# Patient Record
Sex: Female | Born: 1940 | Race: White | Hispanic: No | Smoking: Never smoker
Health system: Southern US, Community
[De-identification: ages and names within clinical notes are randomized; demographics above are authoritative.]

## PROBLEM LIST (undated history)

## (undated) DIAGNOSIS — I10 Essential (primary) hypertension: Secondary | ICD-10-CM

## (undated) DIAGNOSIS — M7061 Trochanteric bursitis, right hip: Secondary | ICD-10-CM

## (undated) DIAGNOSIS — Z9289 Personal history of other medical treatment: Secondary | ICD-10-CM

## (undated) DIAGNOSIS — M1990 Unspecified osteoarthritis, unspecified site: Secondary | ICD-10-CM

## (undated) DIAGNOSIS — E785 Hyperlipidemia, unspecified: Secondary | ICD-10-CM

## (undated) DIAGNOSIS — M5416 Radiculopathy, lumbar region: Secondary | ICD-10-CM

## (undated) DIAGNOSIS — G479 Sleep disorder, unspecified: Secondary | ICD-10-CM

## (undated) DIAGNOSIS — K279 Peptic ulcer, site unspecified, unspecified as acute or chronic, without hemorrhage or perforation: Secondary | ICD-10-CM

## (undated) HISTORY — DX: Hyperlipidemia, unspecified: E78.5

## (undated) HISTORY — DX: Peptic ulcer, site unspecified, unspecified as acute or chronic, without hemorrhage or perforation: K27.9

## (undated) HISTORY — PX: TOTAL KNEE ARTHROPLASTY: SHX125

## (undated) HISTORY — DX: Unspecified osteoarthritis, unspecified site: M19.90

## (undated) HISTORY — DX: Essential (primary) hypertension: I10

## (undated) HISTORY — DX: Personal history of other medical treatment: Z92.89

## (undated) HISTORY — PX: NASAL TURBINATE REDUCTION: SHX2072

## (undated) HISTORY — DX: Trochanteric bursitis, right hip: M70.61

## (undated) HISTORY — DX: Sleep disorder, unspecified: G47.9

## (undated) HISTORY — DX: Radiculopathy, lumbar region: M54.16

---
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
## 1963-07-04 HISTORY — PX: BREAST BIOPSY: SHX20

## 1968-07-03 HISTORY — PX: WISDOM TOOTH EXTRACTION: SHX21

## 1973-07-03 HISTORY — PX: TUBAL LIGATION: SHX77

## 1983-07-04 HISTORY — PX: ANKLE SURGERY: SHX546

## 1985-07-03 HISTORY — PX: ABDOMINAL HYSTERECTOMY: SHX81

## 1985-07-03 HISTORY — PX: TOTAL ABDOMINAL HYSTERECTOMY: SHX209

## 1988-07-03 HISTORY — PX: BREAST REDUCTION SURGERY: SHX8

## 1998-02-01 ENCOUNTER — Other Ambulatory Visit: Admission: RE | Admit: 1998-02-01 | Discharge: 1998-02-01 | Payer: Self-pay | Admitting: Obstetrics and Gynecology

## 1998-04-21 ENCOUNTER — Ambulatory Visit (HOSPITAL_COMMUNITY): Admission: RE | Admit: 1998-04-21 | Discharge: 1998-04-21 | Payer: Self-pay | Admitting: Obstetrics and Gynecology

## 1999-02-09 ENCOUNTER — Other Ambulatory Visit: Admission: RE | Admit: 1999-02-09 | Discharge: 1999-02-09 | Payer: Self-pay | Admitting: Obstetrics and Gynecology

## 1999-04-25 ENCOUNTER — Ambulatory Visit (HOSPITAL_COMMUNITY): Admission: RE | Admit: 1999-04-25 | Discharge: 1999-04-25 | Payer: Self-pay | Admitting: Obstetrics and Gynecology

## 1999-04-25 ENCOUNTER — Encounter: Payer: Self-pay | Admitting: Obstetrics and Gynecology

## 2000-02-15 ENCOUNTER — Other Ambulatory Visit: Admission: RE | Admit: 2000-02-15 | Discharge: 2000-02-15 | Payer: Self-pay | Admitting: Obstetrics and Gynecology

## 2000-04-26 ENCOUNTER — Encounter: Payer: Self-pay | Admitting: Obstetrics and Gynecology

## 2000-04-26 ENCOUNTER — Ambulatory Visit (HOSPITAL_COMMUNITY): Admission: RE | Admit: 2000-04-26 | Discharge: 2000-04-26 | Payer: Self-pay | Admitting: Obstetrics and Gynecology

## 2001-02-22 ENCOUNTER — Other Ambulatory Visit: Admission: RE | Admit: 2001-02-22 | Discharge: 2001-02-22 | Payer: Self-pay | Admitting: Obstetrics and Gynecology

## 2001-12-31 ENCOUNTER — Encounter: Admission: RE | Admit: 2001-12-31 | Discharge: 2001-12-31 | Payer: Self-pay | Admitting: Otolaryngology

## 2001-12-31 ENCOUNTER — Encounter: Payer: Self-pay | Admitting: Otolaryngology

## 2002-07-03 HISTORY — PX: CHOLECYSTECTOMY: SHX55

## 2002-07-03 HISTORY — PX: CATARACT EXTRACTION W/ INTRAOCULAR LENS  IMPLANT, BILATERAL: SHX1307

## 2002-08-04 ENCOUNTER — Encounter (HOSPITAL_BASED_OUTPATIENT_CLINIC_OR_DEPARTMENT_OTHER): Payer: Self-pay | Admitting: General Surgery

## 2002-08-05 ENCOUNTER — Encounter (INDEPENDENT_AMBULATORY_CARE_PROVIDER_SITE_OTHER): Payer: Self-pay | Admitting: *Deleted

## 2002-08-05 ENCOUNTER — Ambulatory Visit (HOSPITAL_COMMUNITY): Admission: RE | Admit: 2002-08-05 | Discharge: 2002-08-06 | Payer: Self-pay | Admitting: General Surgery

## 2002-08-05 ENCOUNTER — Encounter (HOSPITAL_BASED_OUTPATIENT_CLINIC_OR_DEPARTMENT_OTHER): Payer: Self-pay | Admitting: General Surgery

## 2002-09-29 ENCOUNTER — Other Ambulatory Visit: Admission: RE | Admit: 2002-09-29 | Discharge: 2002-09-29 | Payer: Self-pay | Admitting: Gynecology

## 2002-10-02 ENCOUNTER — Ambulatory Visit (HOSPITAL_COMMUNITY): Admission: RE | Admit: 2002-10-02 | Discharge: 2002-10-02 | Payer: Self-pay | Admitting: Gynecology

## 2002-10-02 ENCOUNTER — Encounter: Payer: Self-pay | Admitting: Gynecology

## 2003-03-03 ENCOUNTER — Encounter: Admission: RE | Admit: 2003-03-03 | Discharge: 2003-03-26 | Payer: Self-pay | Admitting: Orthopedic Surgery

## 2003-11-17 ENCOUNTER — Other Ambulatory Visit: Admission: RE | Admit: 2003-11-17 | Discharge: 2003-11-17 | Payer: Self-pay | Admitting: Gynecology

## 2003-12-04 ENCOUNTER — Ambulatory Visit (HOSPITAL_COMMUNITY): Admission: RE | Admit: 2003-12-04 | Discharge: 2003-12-04 | Payer: Self-pay | Admitting: Gynecology

## 2004-03-10 ENCOUNTER — Encounter: Admission: RE | Admit: 2004-03-10 | Discharge: 2004-03-10 | Payer: Self-pay | Admitting: Family Medicine

## 2004-11-22 ENCOUNTER — Other Ambulatory Visit: Admission: RE | Admit: 2004-11-22 | Discharge: 2004-11-22 | Payer: Self-pay | Admitting: Gynecology

## 2005-04-03 ENCOUNTER — Ambulatory Visit (HOSPITAL_COMMUNITY): Admission: RE | Admit: 2005-04-03 | Discharge: 2005-04-03 | Payer: Self-pay | Admitting: Gynecology

## 2005-06-14 ENCOUNTER — Ambulatory Visit (HOSPITAL_COMMUNITY): Admission: RE | Admit: 2005-06-14 | Discharge: 2005-06-14 | Payer: Self-pay | Admitting: Family Medicine

## 2005-07-14 HISTORY — PX: COLONOSCOPY: SHX174

## 2005-07-24 ENCOUNTER — Inpatient Hospital Stay (HOSPITAL_COMMUNITY): Admission: EM | Admit: 2005-07-24 | Discharge: 2005-07-26 | Payer: Self-pay | Admitting: Emergency Medicine

## 2005-09-04 ENCOUNTER — Observation Stay (HOSPITAL_COMMUNITY): Admission: EM | Admit: 2005-09-04 | Discharge: 2005-09-05 | Payer: Self-pay | Admitting: Emergency Medicine

## 2005-11-23 ENCOUNTER — Other Ambulatory Visit: Admission: RE | Admit: 2005-11-23 | Discharge: 2005-11-23 | Payer: Self-pay | Admitting: Gynecology

## 2006-04-26 ENCOUNTER — Ambulatory Visit (HOSPITAL_COMMUNITY): Admission: RE | Admit: 2006-04-26 | Discharge: 2006-04-26 | Payer: Self-pay | Admitting: Gynecology

## 2007-05-29 ENCOUNTER — Ambulatory Visit (HOSPITAL_COMMUNITY): Admission: RE | Admit: 2007-05-29 | Discharge: 2007-05-29 | Payer: Self-pay | Admitting: Gynecology

## 2008-04-13 ENCOUNTER — Encounter: Admission: RE | Admit: 2008-04-13 | Discharge: 2008-04-13 | Payer: Self-pay | Admitting: Neurology

## 2008-05-26 ENCOUNTER — Ambulatory Visit (HOSPITAL_COMMUNITY): Admission: RE | Admit: 2008-05-26 | Discharge: 2008-05-26 | Payer: Self-pay | Admitting: Gynecology

## 2010-07-23 ENCOUNTER — Encounter: Payer: Self-pay | Admitting: Gynecology

## 2010-09-24 IMAGING — CT CT HEAD WO/W CM
1 of 2 series · 13 of 30 positions shown, 17 images · non-contrast
Comparison: none

[Series 32: 3d filtered head w/o · axial · non-contrast · 0.43mm/px · z∈[-12,+123]mm · 13 of 32 slices shown, 17 images]
[im 3/32  brain]
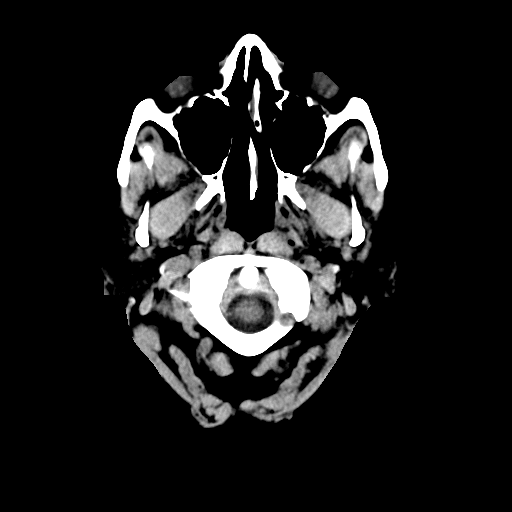
[im 3/32  bone]
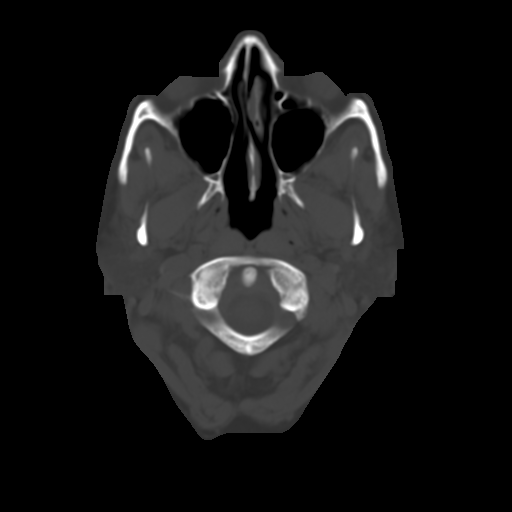
[im 5/32  brain]
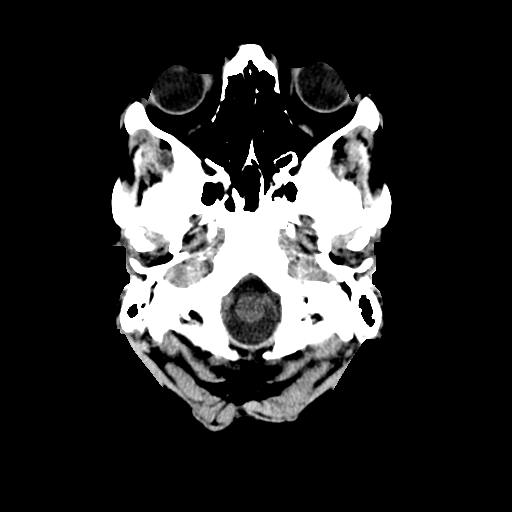
[im 7/32  brain]
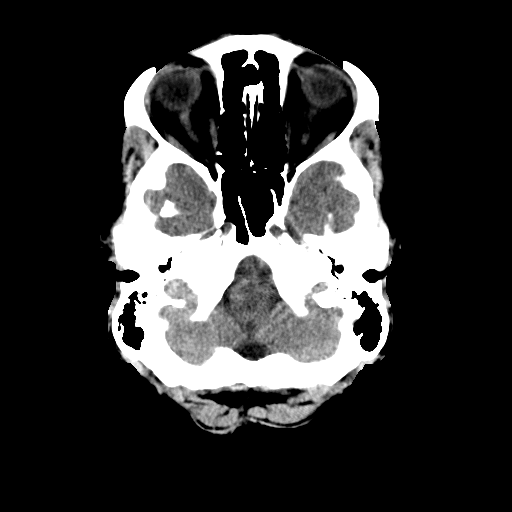
[im 9/32  brain]
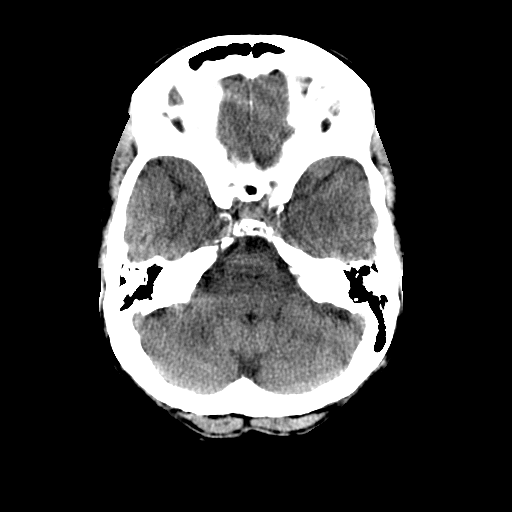
[im 12/32  brain]
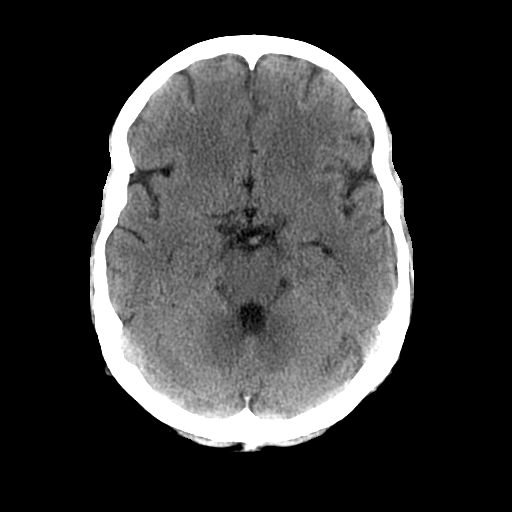
[im 12/32  bone]
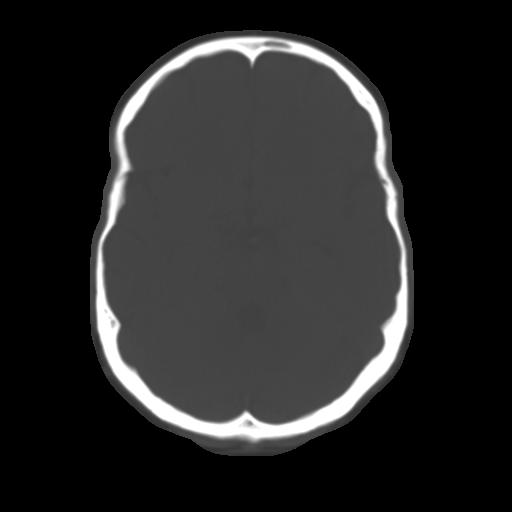
[im 14/32  brain]
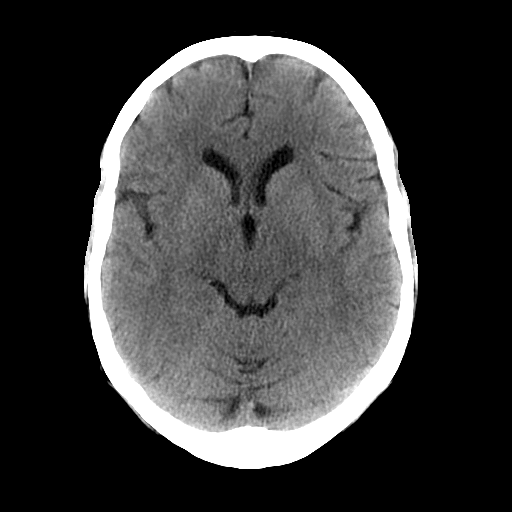
[im 16/32  brain]
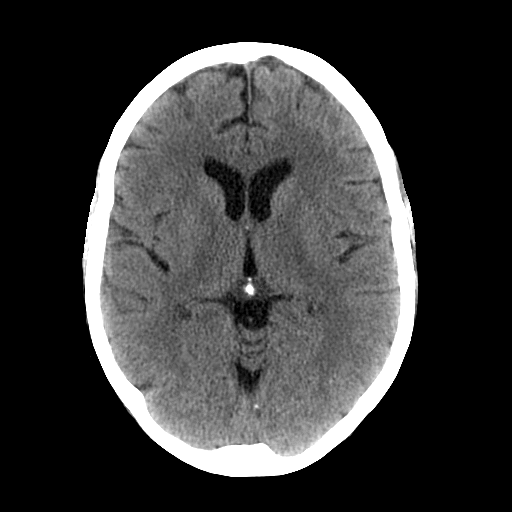
[im 18/32  brain]
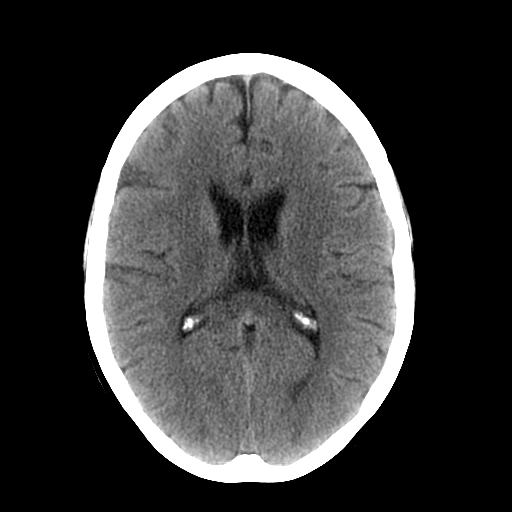
[im 20/32  brain]
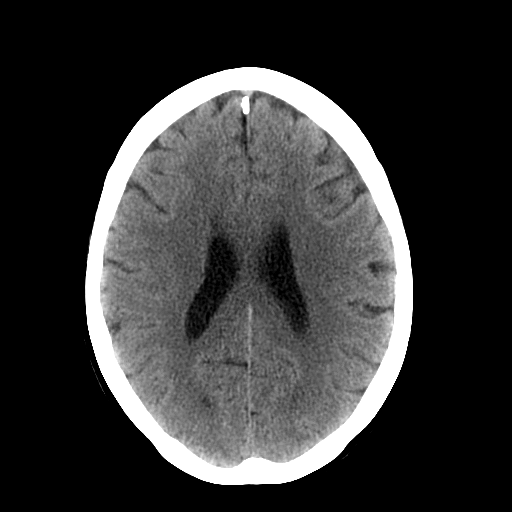
[im 20/32  bone]
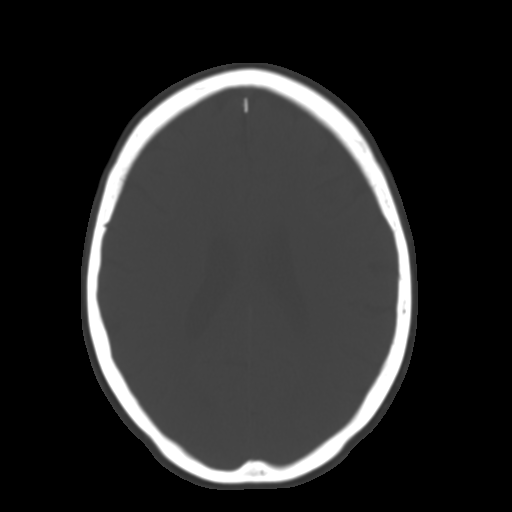
[im 23/32  brain]
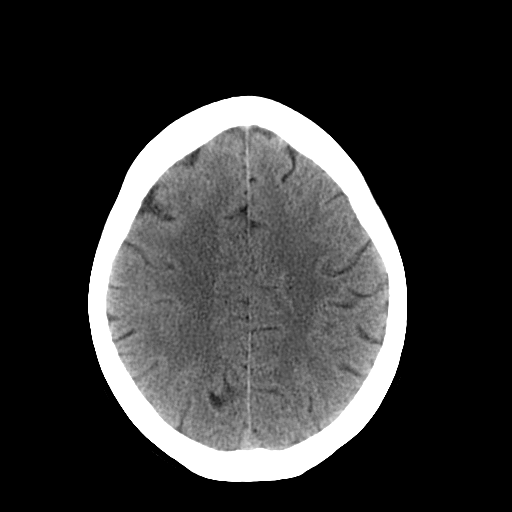
[im 25/32  brain]
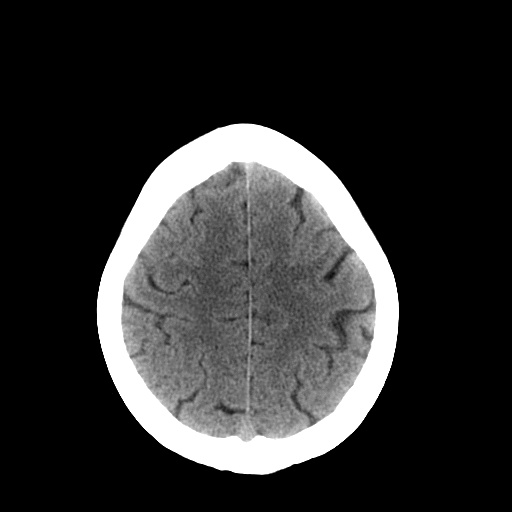
[im 27/32  brain]
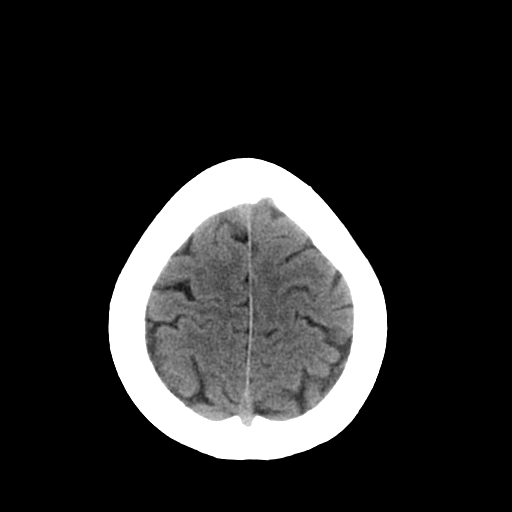
[im 29/32  brain]
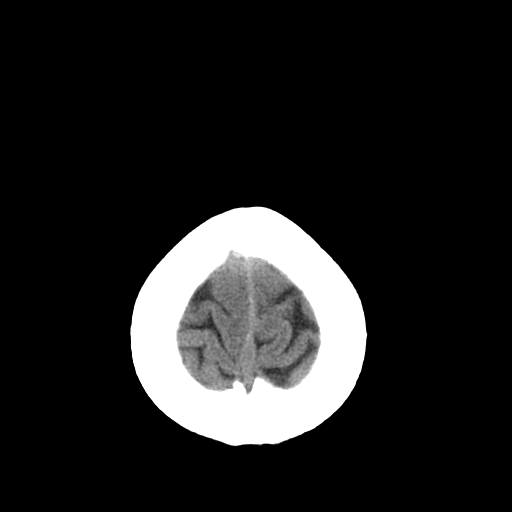
[im 29/32  bone]
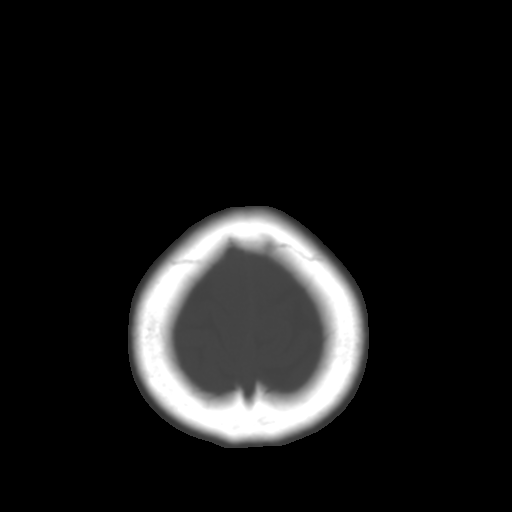

[13 of 30 positions shown; findings below may reference images not displayed]

This examination was performed at [HOSPITAL] at [HOSPITAL]
[HOSPITAL]. The interpretation will be provided by [REDACTED]

## 2010-11-18 NOTE — Discharge Summary (Signed)
NAMESERGIO, HOBART              ACCOUNT NO.:  [id number]   MEDICAL RECORD NO.:  [id number]          PATIENT TYPE:  INP   LOCATION:  5743                         FACILITY:  [facility]   PHYSICIAN:  Petra Kuba, M.D.    DATE OF BIRTH:  1941/01/13   DATE OF ADMISSION:  07/24/2005  DATE OF DISCHARGE:  07/26/2005                                 DISCHARGE SUMMARY   HISTORY:  Patient was admitted with a probable post polypectomy bleed.  She  did have weakness, dizziness, and near syncope and did drop her hemoglobin  from an admission of 11 to 8.  Her BUN with hydration went from 20 down to  8.  Dr. Juanda Chance instead of my partner was called but she gave the patient 1  unit of blood and her hemoglobin came up nicely.  Seemingly had stopped  bleeding but while we were trying to advance her diet on the 23rd she began  having more bright red blood per rectum and we elected to give her one more  unit of blood and proceed with an endoscopy since she had been on  nonsteroidals at home to rule out that as an etiology.  She underwent the  endoscopy on the 23rd in the afternoon which did show some antral erosions  and a linear ulceration, but no blood in the stomach.  We kept her on clear  liquids overnight but she had no further signs of bleeding and her  hemoglobin came up nicely and it was elected to advance her diet and send  her home compared to rechecking her colon.   DISCHARGE DIAGNOSES:  1.  Hypertension.  2.  History of kidney surgery.  3.  Ankle surgery.  4.  Hysterectomy.  5.  Breast reduction.  6.  Cataracts.  7.  Cholecystectomy.   CURRENT MEDICAL PROBLEMS:  1.  Gastrointestinal bleed probably post polypectomy.  2.  Anemia secondary to the gastrointestinal bleed.   CONDITION ON DISCHARGE:  Improved.   DISCHARGE INSTRUCTIONS:  Continue home medicines except for Aleve.  Will  give her Ultram for pain.  Prescription was written.  If she ever goes back  on the Aleve which we have told  her no aspirin or nonsteroidals for two  weeks she has been instructed to take over-the-counter Prilosec  once a day.  Possibly we can look into prescription pump inhibitors in the future if that  becomes expensive for her.  Diet is to advance as tolerated but no nuts,  seeds, popcorn, or uncooked veggies.  Follow-up will be next week with me  since Dr. Evette Cristal is not in the office to recheck symptoms and CBC.  I have  instructed her to take extra iron one time a day but be careful it can  constipate her or turn her stools black which might scare her.  She is  instructed to call with increased weakness,  dizziness, shortness of breath, or signs of bleeding.  I have okayed her  work half days for one week and then probably full-time at that junction or  we can decide based on her  hemoglobin how she is doing when we see her back.  As mentioned above, condition on discharge improved.           ______________________________  Petra Kuba, M.D.     MEM/MEDQ  D:  07/26/2005  T:  07/26/2005  Job:  [id number]   cc:   Thelma Barge P. [place] Charon, M.D.  Fax: [phone]   Graylin Shiver, M.D.  Fax: [phone]

## 2010-11-18 NOTE — H&P (Signed)
Bethany Schneider, Bethany Schneider              ACCOUNT NO.:  [id number]   MEDICAL RECORD NO.:  [id number]          PATIENT TYPE:  INP   LOCATION:  5743                         FACILITY:  [facility]   PHYSICIAN:  Petra Kuba, M.D.    DATE OF BIRTH:  1940/12/11   DATE OF ADMISSION:  07/24/2005  DATE OF DISCHARGE:                                HISTORY & PHYSICAL   HISTORY:  The patient is seen at the request of the ER physician for bright  red blood per rectum.  She had undergone an uneventful colonoscopy on  July 14, 2005.  Two polyps were removed using the snare.  She also had  some diverticula.  She had been doing fine until yesterday, noted some black  stool, but today began passing some bright red blood and a little bit of  black stool, did have some clots, and also had some increased dizziness and  weakness.  She presented to the emergency room.  She is actually feeling  better.  She has had no upper tract symptoms, and had been moving her bowels  fairly normally after her colonoscopy.  She has not had any previous  bleeding.  She had been taking Aleve after the colonoscopy despite being  told to hold off since Tylenol causes leg swelling and does not help her  arthritis.   PAST MEDICAL HISTORY:  1.  Hypertension.  2.  Kidney surgery.  3.  Ankle surgery.  4.  Hysterectomy.  5.  Breast reduction.  6.  Cataract surgery.  7.  Cholecystectomy.   FAMILY HISTORY:  Noncontributory.  No obvious GI problems.   SOCIAL HISTORY:  Does not smoke or drink, but does use Aleve and previous  Naprosyn as mentioned above.   REVIEW OF SYSTEMS:  Pertinent for feeling better, still having some bright  red blood per rectum.   PHYSICAL EXAMINATION:  VITAL SIGNS:  See chart.  GENERAL:  No acute distress.  LUNGS:  Clear.  HEART:  Regular rate and rhythm.  ABDOMEN:  Soft, nontender.   LABORATORY DATA:  Pertinent for normal white count, hemoglobin 11.2, MCV 85,  normal platelet count.  Chemistries  pertinent for a BUN of 20, creatinine  0.9.  Liver tests normal.  Albumin 3.2.   ASSESSMENT:  1.  Probable post-polypectomy bleed, could be diverticula or upper tract      with her taking non-steroidal's.  2.  High blood pressure and arthritis.  3.  Status post kidney surgery, ankle surgery, hysterectomy, breast      reduction, cataract, and cholecystectomy.   PLAN:  We will observe her overnight.  Allow clear liquids.  Consider  endoscopy unprepped flex or a quick prep and colonoscopy pending continued  bleeding.  We will watch stools and color.  Follow hemoglobin and  hematocrit.  Start Protonix.  Warned her no more aspirin or non-steroidal's for a  probable two weeks.  May need to try Ultram, Darvocet, Vicodin since Tylenol  does not work.  We will resume home eye drops, estrogen, Norvasc, and  Ambien, and decide further workup and plans pending continual bleeding.  ______________________________  Petra Kuba, M.D.     MEM/MEDQ  D:  07/24/2005  T:  07/24/2005  Job:  [id number]   cc:   Thelma Barge P. [place] Charon, M.D.  Fax: [phone]   Graylin Shiver, M.D.  Fax: [phone]

## 2010-11-18 NOTE — Op Note (Signed)
NAMENEVAEN, TREDWAY              ACCOUNT NO.:  [id number]   MEDICAL RECORD NO.:  [id number]          PATIENT TYPE:  INP   LOCATION:  5743                         FACILITY:  [facility]   PHYSICIAN:  Petra Kuba, M.D.    DATE OF BIRTH:  1940-11-08   DATE OF PROCEDURE:  DATE OF DISCHARGE:                                 OPERATIVE REPORT   PROCEDURE:  Esophagogastroduodenoscopy with biopsy.   ENDOSCOPIST:  Petra Kuba, M.D.   INDICATION:  Melena, anemia, signs of GI bleeding, want to rule out upper  source in a patient on nonsteroidals.  Consent was signed after risks,  benefits, methods and options were thoroughly discussed multiple times in  the last 2 days.   MEDICINES USED:  Demerol 50 mg, Versed 7.5 mg.   PROCEDURE:  Video endoscope was inserted by direct vision.  The esophagus  was normal.  She did have a tiny hiatal hernia.  Scope passed into the  stomach, no blood was seen, advanced to the antrum, where a few shallow  antral erosions were seen, advanced through a normal pylorus into the  duodenal bulb, pertinent for some minimal bulbitis, and around the C-loop to  a normal second portion of the duodenum.  No blood was seen distally.  Scope  was withdrawn back to the bulb and a good look there ruled out adverse  lesions.  The scope was withdrawn back to stomach and retroflexed.  Cardia,  fundus, angularis, lesser and greater curve were normal on retroflex  visualization.  Straight visualization of the stomach confirmed the antral  erosions.  She did have 1 distal greater curve linear erosion/ulcer not with  a worrisome stigmata or appearance.  One CLO biopsy the antrum was obtained  to rule out Helicobacter.  The rest of the stomach was normal.  Air was  suctioned and the scope was slowly withdrawn again.  A good look at the  esophagus on slow withdrawal was normal, except the tiny hiatal hernia.  Scope was removed.  The patient tolerated the procedure well.  There was no  obvious immediate complication.   ENDOSCOPIC DIAGNOSES:  1.  Tiny hiatal hernia.  2.  Antral erosions and a linear ulceration of the distal stomach, status      post CLO biopsy.  3.  Minimal bulbitis.  4.  Otherwise normal esophagogastroduodenoscopy without any heme seen.   PLAN:  I believe she is having a post-polypectomy versus diverticular  bleeding and if she continues to bleed, a colonoscopy tomorrow, otherwise  hopefully can advance diet and go home.  We will observe overnight,  continuing to watch H&H; however, in the future when she needs to go back on  her nonsteroidals in 2 weeks, we will change her to over-the-counter  Prilosec or prescription pump inhibitors instead of her Zantac.           ______________________________  Petra Kuba, M.D.     MEM/MEDQ  D:  07/25/2005  T:  07/26/2005  Job:  [id number]   cc:   Graylin Shiver, M.D.  Fax: [phone]   Maryla Morrow.  [place] Charon, M.D.  Fax: [phone]

## 2010-11-18 NOTE — Consult Note (Signed)
[name], Schneider              ACCOUNT NO.:  [id number]   MEDICAL RECORD NO.:  [id number]          PATIENT TYPE:  INP   LOCATION:  3711                         FACILITY:  [facility]   PHYSICIAN:  Francisca December, M.D.  DATE OF BIRTH:  [birth year]   DATE OF CONSULTATION:  09/04/2005  DATE OF DISCHARGE:  09/05/2005                                   CONSULTATION   CARDIOLOGY CONSULTATION   DATE OF CONSULTATION:  September 04, 2005.   REASON FOR CONSULTATION:  Back, shoulder and jaw pain.   HISTORY OF PRESENT ILLNESS:  Bethany Schneider is a pleasant 70 year old woman  without prior cardiac history who this morning had the rather sudden onset  of bilateral shoulder aching pain at rest which she described as a tight  band.  It radiated to both shoulders and to the right jaw.  It also did have  some sharp quality to it.  She took aspirin at home without any relief, 25  mg.  There was no shortness of breath but she was quite diaphoretic and  somewhat light headed.  No nausea or vomiting.  The pain became  intermittent, would last two to five minutes and then resolve only to recur  again another 15 minutes later or so.  She finally came to the emergency  room by private vehicle around noon where she was given sublingual  nitroglycerin X2 with some improvement but not complete improvement in her  pain.  At the time of my evaluation at 17:00 she has had resolution of  anterior indigestion-type pain and the right jaw discomfort.  She still is  left with some of the back discomfort.  She has topical nitroglycerin, one  inch on at this time.  She also has a nitroglycerin-induced headache.   PAST MEDICAL HISTORY:  1.  Hypertension.  2.  Osteoarthritis.  3.  Postmenopausal state.  4.  Chronic insomnia.  5.  Glaucoma.   ALLERGIES:  PENICILLIN causes a rash and some swelling.   CURRENT MEDICATIONS:  1.  Amlodipine 5 mg p.o. daily.  2.  Mobic 7.5 mg every other day.  3.  Iron 160 mg daily.  4.   Alphagan eye drops each eye twice daily.  5.  Estradiol 1 mg daily.  6.  Ambien 10 mg p.o. q.h.s.  7.  Calcium, magnesium and zinc supplements.   PAST SURGICAL HISTORY:  Kidney surgery in 1962 ? type.  Hysterectomy 1987.  Bilateral cataract surgery in 2003 and 2004.  Cholecystectomy in 2004.  Ankle surgery in 1985.   FAMILY HISTORY:  Father died of cerebrovascular accident in his 29's.  No  history of early coronary disease.   SOCIAL HISTORY:  She is married, lives with her husband in [place].  No  tobacco, ethanol or illicit drug use.   REVIEW OF SYSTEMS:  Negative except as mentioned above.  She does not have  diabetes and she is unaware of what her cholesterol is.   PHYSICAL EXAMINATION:  VITAL SIGNS:  Blood pressure is 136/82, pulse is 70  and regular, respiratory rate 20, temperature 98.7, oxygen saturation on  2L  is 99%.  GENERAL APPEARANCE:  This is a pleasant, cooperative, 70 year old woman in  no acute distress.  HEENT:  Unremarkable.  Head is normocephalic, atraumatic.  Pupils equal,  round, reactive to light and accommodation. Extraocular movements intact.  Sclerae are anicteric. Oral mucosa is pink and moist.  Tongue is not coated.  NECK:  Is supple, without thyromegaly or masses.  The carotid upstrokes are  normal.  There is no bruit.  There is no jugular venous distention.  CHEST:  The chest is clear with good excursion bilaterally. No wheezes,  rales or rhonchi.  HEART:  The heart has a regular rhythm with normal S1 and S2 heard.  No S3  or S4, murmurs, clicks or rubs are noted.  ABDOMEN:  The abdomen is soft, flat, nontender.  No hepatosplenomegaly or  midline pulsatile mass.  EXTREMITIES:  Lower extremities have no edema.  Distal pulses are intact.  NEUROLOGICAL:  Cranial nerves II-XII are intact.  Motor and sensory are  grossly intact.  Gait is not tested. Skin is warm and dry and clear.   ACCESSORY CLINICAL DATA:  Point of care cardiac enzymes X1 are  negative.  D-  dimer is less than 0.22.  Serum electrolytes, BUN, creatinine and glucose  are all within normal limits.  Admission hemogram is normal.  Electrocardiogram shows poor R wave progression, otherwise unremarkable.  Chest x-ray with no acute findings.   ASSESSMENT:  70 year old woman with the abrupt onset of intrascapular pain  radiating to the shoulders and jaw, also with some anterior indigestion  discomfort.  Certainly this would be an atypical presentation for an acute  coronary syndrome but not out of the question.  Another concern of mine  might be the possibility of an aortic dissection, although that seems quite  unlikely.  Cardiac risk factors are age, hypertension and possibly  dyslipidemia.   PLAN:  1.  Agree with your management thus far.  Will convert topical to      intravenous nitroglycerin as she did have some relief with the topical.  2.  PRN acetaminophen for nitroglycerin-induced headache.  3.  Begin subcutaneous Lovenox 1 mg/kg q.12h.  4.  Obtain a CT scan of the chest to rule out aortic aneurysm.  5.  Q.8h. cardiac enzymes, CK-MB and troponin's.  6.  Repeat electrocardiogram in the morning.  7.  Will decide on further diagnostic studies based on the findings from      above.      Francisca December, M.D.  Electronically Signed    JHE/MEDQ  D:  09/04/2005  T:  09/05/2005  Job:  [id number]   cc:   Maryla Morrow. Modesto Charon, M.D.  Fax: [phone]

## 2010-11-18 NOTE — Op Note (Signed)
[name], Bethany Schneider                          ACCOUNT NO.:  [id number]   MEDICAL RECORD NO.:  [id number]                   PATIENT TYPE:  OIB   LOCATION:  2899                                 FACILITY:  [facility]   PHYSICIAN:  Leonie Man, M.D.                DATE OF BIRTH:  1940/12/03   DATE OF PROCEDURE:  08/05/2002  DATE OF DISCHARGE:                                 OPERATIVE REPORT   PREOPERATIVE DIAGNOSIS:  Chronic calculous cholecystitis.   POSTOPERATIVE DIAGNOSIS:  Chronic calculous cholecystitis.   PROCEDURE:  Laparoscopic cholecystectomy with intraoperative cholangiogram.   SURGEON:  Leonie Man, M.D.   ASSISTANT:  Joanne Gavel, M.D.   ANESTHESIA:  General.   DESCRIPTION OF PROCEDURE:  This patient is a 70 year old female with several  episodes of upper abdominal pain, nausea and vomiting, occasional bloating  and associated fatty food intolerance.  No history of fever, chills or  jaundice.  Gallbladder and abdominal ultrasound shows cholelithiasis with  multiple mobile gallstones of the common duct without defects and the  gallbladder wall is normal.  The patient comes to the operating room for a  laparoscopic cholecystectomy and cholangiogram after the risks and potential  benefits of surgery have been fully discussed, all questions answered and  consent obtained.   DESCRIPTION OF PROCEDURE:  Following the induction of satisfactory general  anesthesia, with the patient positioned supinely, the abdomen was routinely  prepped and draped to be included in the sterile operative field, open  laparoscopy created at the umbilicus, insertion of a Hasson cannula,  insufflation of the peritoneal cavity to 14 mmHg using CO2.  Camera was  inserted into the umbilical port and visual exploration of the abdomen  carried out.  Liver edge is sharp, liver surface is smooth, the gallbladder  appeared to be chronically scarred with several adhesions of the omentum up  to the  gallbladder wall.  Anterior gastric wall and duodenal sweep were  normal.  None of the small and large intestine that was viewed appeared to  be abnormal.  The pelvic organs were not visualized.  Under direct vision,  epigastric and lateral ports were placed.  The gallbladder was grasped and  retracted cephalad and the dissection carried down to the region of the  ampulla, the gallbladder dissected free of the cystic artery and the cystic  duct.  The cystic duct was traced up to the cystic duct-gallbladder junction  and the cystic artery traced up to the gallbladder wall.  The cystic artery  was doubly clipped and transected.  The cystic duct was clipped proximally  and opened.  A Reddick cystic duct catheter was passed into the abdomen  through a 14-gauge Angiocath and inserted into the cystic duct.  Cholangiogram was taken fluoroscopically, showing free flow of contrast into  the common bile duct, hepatic duct, hepatic radicals and duodenum.  There  were no filling defects noted.  The  cholangiocatheter was removed and the  cystic duct was doubly clipped and transected.  The gallbladder was then  dissected free from the liver bed using electrocautery and maintaining  hemostasis throughout the course of the dissection.  At the end of the  dissection, the liver bed was thoroughly inspected and any additional  bleeding points were treated with electrocautery.  Right upper quadrant was  then thoroughly irrigated with normal saline and sucked dry.  The camera was  placed in the epigastric port and the gallbladder was retrieved through the  umbilical port, having been placed in an EndoCatch.  The pneumoperitoneum  was deflated after the trocars were removed under direct vision.  Sponge,  instruments and sharp counts were verified and the wounds closed in layers  as follows:  The umbilical wound in two layers with 0 Dexon and 4-0 Dexon,  epigastric and lateral flank wounds were closed with 4-0  Dexon sutures.  All  wounds were then reinforced with Steri-Strips and sterile dressing were  applied, anesthetic reversed and the patient removed from the operating room  to the recovery room in stable condition.  She tolerated the procedure well.                                               Leonie Man, M.D.    PB/MEDQ  D:  08/05/2002  T:  08/05/2002  Job:  [id number]

## 2010-11-18 NOTE — H&P (Signed)
NAMEBURDETTE, Bethany              ACCOUNT NO.:  [id number]   MEDICAL RECORD NO.:  [id number]          PATIENT TYPE:  INP   LOCATION:  3711                         FACILITY:  [facility]   PHYSICIAN:  Hollice Espy, M.D.DATE OF BIRTH:  14-Feb-1941   DATE OF ADMISSION:  09/04/2005  DATE OF DISCHARGE:                                HISTORY & PHYSICAL   PRIMARY CARE PHYSICIAN:  Francis P. Mod[place] Charon, M.D.   CONSULTANTS IN THIS CASE:  Meade Maw, M.D., [facility].   CHIEF COMPLAINT:  Chest squeezing.   HISTORY OF PRESENT ILLNESS:  The patient is a 70 year old white female with  a past medical history of hyperlipidemia, hypertension, and osteoporosis who  presents to the emergency room after having an episode of chest squeezing.  The patient states she has never had anything like this.  She woke up at  approximately 3:30 in the morning with a pain that she initially thought was  underneath her right axilla, described as almost like a squeezing vice going  around her body.  She had no associated shortness of breath, no other arm  radiation.  After trying to move around and it not getting better, she tried  walking around, and all of a sudden felt quite lightheaded.  Symptoms  persisted, and she became concerned after a while and took an aspirin  because she was worried it might be her heart.  She continued to have  persistent symptoms and finally decided to come to the emergency room for  further evaluation.   Her EKG showed normal sinus rhythm and was unremarkable.  Cardiac enzymes  were, as well.  Due to persistence, the patient received a dose of  nitroglycerin, which she said has greatly decreased her pain to the point  where it is almost barely noticeable.  Currently, she states that she is  doing okay.  She has a mild headache from the nitroglycerin, but denies any  visual changes, no dysphagia, no chest pain, no palpitations, no shortness  of breath, no wheezing, no coughing,  no abdominal pain, no hematuria,  dysuria, constipation, diarrhea, focal extremity numbness, weakness or pain.   REVIEW OF SYSTEMS:  Otherwise negative.   PAST MEDICAL HISTORY:  1.  Hypertension.  2.  She has had her cholesterol checked about 2 years ago and was told it      was elevated, but she has not been on any medication for this.  3.  Osteoporosis.  4.  Recent colon bleed secondary to a polypectomy about 1 month ago.   CURRENT MEDICATIONS:  1.  Norvasc.  2.  Prilosec.  3.  _________.  4.  Multivitamin.  5.  Os-Cal.  6.  Estrogen.   ALLERGIES:  PENICILLIN.   SOCIAL HISTORY:  She denies any tobacco or alcohol or drug use.   FAMILY HISTORY:  Notable for dad with CVA.  No history of heart disease in  the family.   PHYSICAL EXAMINATION:  VITAL SIGNS:  The patient's vitals on admission  revealed a temperature of 98.7, heart rate 94, blood pressure 171/80,  respirations 20, O2 saturation 99%  on room air.  Since her pain has abated,  her heart rate has come down into the 70s.  Blood pressure is down to  136/82.  GENERAL:  The patient is alert and oriented x3, in no apparent distress  other than her headache.  HEENT:  Normocephalic and atraumatic.  Mucous membranes are moist.  NECK:  She has no carotid bruits.  HEART:  Regular rate and rhythm.  S1 and S2.  LUNGS:  Clear to auscultation bilaterally.  ABDOMEN:  Soft, nontender, nondistended.  Positive bowel sounds.  EXTREMITIES:  No clubbing, cyanosis, or edema.   LABORATORY WORK:  White count 7.9, no shift.  Hemoglobin and hematocrit of  12.3 and 36.  MCV of 84.  Platelet count 382.  Sodium 135, potassium 3.7,  chloride 106, bicarbonate 20, BUN 13, creatinine 0.9, glucose 104.  LFT's  are unremarkable.  Lipase level 25.  D-dimer less than 0.22.  CPK 56.5, MB  less than 1, troponin less than 0.05.  Chest x-ray is unremarkable.  EKG  shows normal sinus rhythm; however, a low voltage QRS is noted, as well.   ASSESSMENT AND  PLAN:  1.  Chest pressure in a patient with hypertension and hyperlipidemia that is      not controlled.  Will admit the patient for observation.  Will check 2      more sets of cardiac enzymes.  I have already asked [facility] for      a consult for a stress test on the patient in the morning.  2.  Hypertension.  Continue Norvasc.  3.  Questionable hyperlipidemia.  Check a fasting lipid profile in the      morning, and, if necessary, recommend a lipid-controlling agent.      Hollice Espy, M.D.  Electronically Signed     SKK/MEDQ  D:  09/04/2005  T:  09/05/2005  Job:  [id number]   cc:   Armanda Magic, M.D.  Fax: [phone]   Maryla Morrow. [place] Charon, M.D.  Fax: [phone]

## 2014-04-23 ENCOUNTER — Ambulatory Visit: Payer: Self-pay | Admitting: Podiatry

## 2014-05-26 ENCOUNTER — Ambulatory Visit: Payer: Self-pay | Admitting: Podiatry

## 2014-09-13 DIAGNOSIS — M5416 Radiculopathy, lumbar region: Secondary | ICD-10-CM

## 2014-09-13 DIAGNOSIS — M7061 Trochanteric bursitis, right hip: Secondary | ICD-10-CM

## 2014-09-13 HISTORY — DX: Radiculopathy, lumbar region: M54.16

## 2014-09-13 HISTORY — DX: Trochanteric bursitis, right hip: M70.61

## 2015-06-03 DIAGNOSIS — G479 Sleep disorder, unspecified: Secondary | ICD-10-CM

## 2015-06-03 HISTORY — DX: Sleep disorder, unspecified: G47.9

## 2017-01-22 LAB — HEPATIC FUNCTION PANEL
ALT: 14 (ref 7–35)
AST: 24 (ref 13–35)
Alkaline Phosphatase: 78 (ref 25–125)

## 2017-01-22 LAB — CBC AND DIFFERENTIAL
HEMATOCRIT: 39 (ref 36–46)
Hemoglobin: 13.1 (ref 12.0–16.0)
Platelets: 352 (ref 150–399)
WBC: 6.7

## 2017-01-22 LAB — BASIC METABOLIC PANEL
BUN: 12 (ref 4–21)
Creatinine: 0.9 (ref <=1.1)
Glucose: 95
Potassium: 4.7 (ref 3.4–5.3)
Sodium: 141 (ref 137–147)

## 2017-01-22 LAB — LIPID PANEL
Cholesterol: 187 (ref 0–200)
HDL: 77 — AB (ref 35–70)
LDL CALC: 98
TRIGLYCERIDES: 58 (ref 40–160)

## 2017-02-08 HISTORY — PX: TOTAL HIP ARTHROPLASTY: SHX124

## 2017-02-12 ENCOUNTER — Encounter: Payer: Self-pay | Admitting: Internal Medicine

## 2017-02-12 NOTE — Progress Notes (Signed)
: Provider:  Randon GoldsmithAnne D. Lyn HollingsheadAlexander, MD [facility]  [facility] Room Number: 505 Place of Service:  SNF (31)  PCP: No primary care provider on file. Patient Care Team: Ok EdwardsFernandez, Juan H, MD as Consulting Physician (Gynecology) Verlin DikeLennon, Kenneth C., MD as Referring Physician (Sports Medicine)  Extended Emergency Contact Information Primary Emergency Contact: [facility] Address: [address]          [place]  [id number] Home Phone: [phone] Relation: None     Allergies: Aspirin; Ciprofloxacin; Percocet [oxycodone-acetaminophen]; Tramadol; and Penicillins  Chief Complaint  Patient presents with  . New Admit To SNF    following hospitalization 02/08/09 to 02/09/17 right total hip arthroplasty  Dr. Louis MatteKenneth Lennon.    HPI: Patient is 76 y.o. female who  Past Medical History:  Diagnosis Date  . Arthritis   . DJD (degenerative joint disease) right hip  . Hyperlipidemia   . Hypertension   . Lumbar radiculopathy 09/13/2014  . Peptic ulceration   . Sleep disturbance 06/03/2015  . Trochanteric bursitis of right hip 09/13/2014    Past Surgical History:  Procedure Laterality Date  . ANKLE SURGERY  1985   cartilage replaced  . BREAST BIOPSY  1965  . BREAST REDUCTION SURGERY Bilateral 1990  . CATARACT EXTRACTION W/ INTRAOCULAR LENS  IMPLANT, BILATERAL  2004  . CHOLECYSTECTOMY  2004  . KIDNEY SURGERY  1962  . NASAL TURBINATE REDUCTION  1987 and 2015  . TOTAL ABDOMINAL HYSTERECTOMY  1987  . 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 Louis MatteKenneth Lennon  . TOTAL KNEE ARTHROPLASTY Bilateral 2012 & 2014  . TUBAL LIGATION  1975  . WISDOM TOOTH EXTRACTION  1970    Allergies as of 02/12/2017      Reactions   Aspirin    Gastric ulcer   Ciprofloxacin    Sores in throat   Percocet [oxycodone-acetaminophen] Nausea Only   Tramadol    Stomach burning   Penicillins Rash      Medication List       Accurate as of 02/12/17  1:24 PM. Always use your most  recent med list.          amLODipine 5 MG tablet Commonly known as:  NORVASC Take 5 mg by mouth daily.   aspirin 325 MG tablet Take 325 mg by mouth. Take one tablet twice daily for 21 days.   atorvastatin 10 MG tablet Commonly known as:  LIPITOR Take 10 mg by mouth daily.   Calcium-Magnesium-Zinc 333-133-5 MG Tabs Take by mouth. Take one tablet 3 times a day   dexlansoprazole 60 MG capsule Commonly known as:  DEXILANT Take 60 mg by mouth daily.   ferrous sulfate 325 (65 FE) MG tablet Take 325 mg by mouth. Take one tablet before breakfast   levofloxacin 750 MG tablet Commonly known as:  LEVAQUIN Take 750 mg by mouth. Take one tablet for 7 days   oxyCODONE-acetaminophen 5-325 MG tablet Commonly known as:  PERCOCET/ROXICET Take by mouth. Take one tablet every 4 hours as needed for pain for up to 7 days   ranitidine 150 MG tablet Commonly known as:  ZANTAC Take 150 mg by mouth. Take 2 tablets nightly for 1 month   sertraline 100 MG tablet Commonly known as:  ZOLOFT Take 100 mg by mouth daily.   therapeutic multivitamin-minerals tablet Take 1 tablet by mouth daily.   zolpidem 10 MG tablet Commonly known as:  AMBIEN Take 10 mg by mouth at bedtime as needed  for sleep.       Meds ordered this encounter  Medications  . aspirin 325 MG tablet    Sig: Take 325 mg by mouth. Take one tablet twice daily for 21 days.  Marland Kitchen oxyCODONE-acetaminophen (PERCOCET/ROXICET) 5-325 MG tablet    Sig: Take by mouth. Take one tablet every 4 hours as needed for pain for up to 7 days  . amLODipine (NORVASC) 5 MG tablet    Sig: Take 5 mg by mouth daily.  Marland Kitchen atorvastatin (LIPITOR) 10 MG tablet    Sig: Take 10 mg by mouth daily.  Marland Kitchen dexlansoprazole (DEXILANT) 60 MG capsule    Sig: Take 60 mg by mouth daily.  . Calcium-Magnesium-Zinc 333-133-5 MG TABS    Sig: Take by mouth. Take one tablet 3 times a day  . ferrous sulfate 325 (65 FE) MG tablet    Sig: Take 325 mg by mouth. Take one tablet  before breakfast  . therapeutic multivitamin-minerals (THERAGRAN-M) tablet    Sig: Take 1 tablet by mouth daily.  . ranitidine (ZANTAC) 150 MG tablet    Sig: Take 150 mg by mouth. Take 2 tablets nightly for 1 month  . sertraline (ZOLOFT) 100 MG tablet    Sig: Take 100 mg by mouth daily.  Marland Kitchen zolpidem (AMBIEN) 10 MG tablet    Sig: Take 10 mg by mouth at bedtime as needed for sleep.  Marland Kitchen levofloxacin (LEVAQUIN) 750 MG tablet    Sig: Take 750 mg by mouth. Take one tablet for 7 days    Immunization History  Administered Date(s) Administered  . PPD Test 02/09/2017    Social History  Substance Use Topics  . Smoking status: Never Smoker  . Smokeless tobacco: Never Used  . Alcohol use No    Family history is   Family History  Problem Relation Age of Onset  . Hypertension Mother   . Heart disease Father   . Hypertension Father   . Cancer Brother       Review of Systems  DATA OBTAINED: from patient, nurse, medical record, family member GENERAL:  no fevers, fatigue, appetite changes SKIN: No itching, or rash EYES: No eye pain, redness, discharge EARS: No earache, tinnitus, change in hearing NOSE: No congestion, drainage or bleeding  MOUTH/THROAT: No mouth or tooth pain, No sore throat RESPIRATORY: No cough, wheezing, SOB CARDIAC: No chest pain, palpitations, lower extremity edema  GI: No abdominal pain, No N/V/D or constipation, No heartburn or reflux  GU: No dysuria, frequency or urgency, or incontinence  MUSCULOSKELETAL: No unrelieved bone/joint pain NEUROLOGIC: No headache, dizziness or focal weakness PSYCHIATRIC: No c/o anxiety or sadness   Vitals:   02/12/17 1148  BP: 130/70  Pulse: 78  Resp: 18  Temp: 98.2 F (36.8 C)  SpO2: 97%    SpO2 Readings from Last 1 Encounters:  02/12/17 97%   Body mass index is 24.72 kg/m.     Physical Exam  GENERAL APPEARANCE: Alert, conversant,  No acute distress.  SKIN: No diaphoresis rash HEAD: Normocephalic, atraumatic    EYES: Conjunctiva/lids clear. Pupils round, reactive. EOMs intact.  EARS: External exam WNL, canals clear. Hearing grossly normal.  NOSE: No deformity or discharge.  MOUTH/THROAT: Lips w/o lesions  RESPIRATORY: Breathing is even, unlabored. Lung sounds are clear   CARDIOVASCULAR: Heart RRR no murmurs, rubs or gallops. No peripheral edema.   GASTROINTESTINAL: Abdomen is soft, non-tender, not distended w/ normal bowel sounds. GENITOURINARY: Bladder non tender, not distended  MUSCULOSKELETAL: No abnormal joints or musculature  NEUROLOGIC:  Cranial nerves 2-12 grossly intact. Moves all extremities  PSYCHIATRIC: Mood and affect appropriate to situation, no behavioral issues  There are no active problems to display for this patient.     Labs reviewed: Basic Metabolic Panel:    Component Value Date/Time   NA 141 01/22/2017   K 4.7 01/22/2017   BUN 12 01/22/2017   CREATININE 0.9 01/22/2017   AST 24 01/22/2017   ALT 14 01/22/2017   ALKPHOS 78 01/22/2017     Recent Labs  01/22/17  NA 141  K 4.7  BUN 12  CREATININE 0.9   Liver Function Tests:  Recent Labs  01/22/17  AST 24  ALT 14  ALKPHOS 78   No results for input(s): LIPASE, AMYLASE in the last 8760 hours. No results for input(s): AMMONIA in the last 8760 hours. CBC:  Recent Labs  01/22/17  WBC 6.7  HGB 13.1  HCT 39  PLT 352   Lipid  Recent Labs  01/22/17  CHOL 187  HDL 77*  LDLCALC 98  TRIG 58    Cardiac Enzymes: No results for input(s): CKTOTAL, CKMB, CKMBINDEX, TROPONINI in the last 8760 hours. BNP: No results for input(s): BNP in the last 8760 hours. No results found for: MICROALBUR No results found for: HGBA1C No results found for: TSH No results found for: VITAMINB12 No results found for: FOLATE No results found for: IRON, TIBC, FERRITIN  Imaging and Procedures obtained prior to SNF admission: No results found.   Not all labs, radiology exams or other studies done during hospitalization  come through on my EPIC note; however they are reviewed by me.    Assessment and Plan  No problem-specific Assessment & Plan notes found for this encounter.   Randon Goldsmith. Lyn Hollingshead, MD   This encounter was created in error - please disregard.

## 2019-06-09 DIAGNOSIS — Z131 Encounter for screening for diabetes mellitus: Secondary | ICD-10-CM | POA: Diagnosis not present

## 2019-06-09 DIAGNOSIS — E782 Mixed hyperlipidemia: Secondary | ICD-10-CM | POA: Diagnosis not present

## 2019-06-09 DIAGNOSIS — Z0000 Encounter for general adult medical examination without abnormal findings: Secondary | ICD-10-CM | POA: Diagnosis not present

## 2019-06-09 DIAGNOSIS — Z13220 Encounter for screening for lipoid disorders: Secondary | ICD-10-CM | POA: Diagnosis not present

## 2019-09-11 DIAGNOSIS — H5203 Hypermetropia, bilateral: Secondary | ICD-10-CM | POA: Diagnosis not present

## 2019-09-12 DIAGNOSIS — H5203 Hypermetropia, bilateral: Secondary | ICD-10-CM | POA: Diagnosis not present

## 2019-12-08 DIAGNOSIS — E782 Mixed hyperlipidemia: Secondary | ICD-10-CM | POA: Diagnosis not present

## 2019-12-08 DIAGNOSIS — I10 Essential (primary) hypertension: Secondary | ICD-10-CM | POA: Diagnosis not present

## 2019-12-08 DIAGNOSIS — F5101 Primary insomnia: Secondary | ICD-10-CM | POA: Diagnosis not present

## 2019-12-08 DIAGNOSIS — R1013 Epigastric pain: Secondary | ICD-10-CM | POA: Diagnosis not present

## 2019-12-24 DIAGNOSIS — N3001 Acute cystitis with hematuria: Secondary | ICD-10-CM | POA: Diagnosis not present

## 2020-01-29 DIAGNOSIS — N3001 Acute cystitis with hematuria: Secondary | ICD-10-CM | POA: Diagnosis not present

## 2020-03-11 DIAGNOSIS — N3941 Urge incontinence: Secondary | ICD-10-CM | POA: Diagnosis not present

## 2020-03-11 DIAGNOSIS — N8110 Cystocele, unspecified: Secondary | ICD-10-CM | POA: Diagnosis not present

## 2020-06-10 DIAGNOSIS — Z0000 Encounter for general adult medical examination without abnormal findings: Secondary | ICD-10-CM | POA: Diagnosis not present

## 2020-06-10 DIAGNOSIS — E782 Mixed hyperlipidemia: Secondary | ICD-10-CM | POA: Diagnosis not present

## 2020-06-10 DIAGNOSIS — K219 Gastro-esophageal reflux disease without esophagitis: Secondary | ICD-10-CM | POA: Diagnosis not present

## 2020-06-10 DIAGNOSIS — F39 Unspecified mood [affective] disorder: Secondary | ICD-10-CM | POA: Diagnosis not present

## 2020-06-10 DIAGNOSIS — R300 Dysuria: Secondary | ICD-10-CM | POA: Diagnosis not present

## 2020-06-10 DIAGNOSIS — R7301 Impaired fasting glucose: Secondary | ICD-10-CM | POA: Diagnosis not present

## 2020-06-10 DIAGNOSIS — I10 Essential (primary) hypertension: Secondary | ICD-10-CM | POA: Diagnosis not present

## 2020-06-10 DIAGNOSIS — R8290 Unspecified abnormal findings in urine: Secondary | ICD-10-CM | POA: Diagnosis not present

## 2020-10-27 DIAGNOSIS — H5203 Hypermetropia, bilateral: Secondary | ICD-10-CM | POA: Diagnosis not present

## 2020-12-09 DIAGNOSIS — F39 Unspecified mood [affective] disorder: Secondary | ICD-10-CM | POA: Diagnosis not present

## 2020-12-09 DIAGNOSIS — F5101 Primary insomnia: Secondary | ICD-10-CM | POA: Diagnosis not present

## 2020-12-09 DIAGNOSIS — I10 Essential (primary) hypertension: Secondary | ICD-10-CM | POA: Diagnosis not present

## 2020-12-09 DIAGNOSIS — E782 Mixed hyperlipidemia: Secondary | ICD-10-CM | POA: Diagnosis not present

## 2021-02-21 DIAGNOSIS — H9193 Unspecified hearing loss, bilateral: Secondary | ICD-10-CM | POA: Diagnosis not present

## 2021-02-21 DIAGNOSIS — R531 Weakness: Secondary | ICD-10-CM | POA: Diagnosis not present

## 2021-02-21 DIAGNOSIS — I700 Atherosclerosis of aorta: Secondary | ICD-10-CM | POA: Diagnosis not present

## 2021-02-21 DIAGNOSIS — N189 Chronic kidney disease, unspecified: Secondary | ICD-10-CM | POA: Diagnosis not present

## 2021-02-21 DIAGNOSIS — N179 Acute kidney failure, unspecified: Secondary | ICD-10-CM | POA: Diagnosis not present

## 2021-02-21 DIAGNOSIS — S72012A Unspecified intracapsular fracture of left femur, initial encounter for closed fracture: Secondary | ICD-10-CM | POA: Diagnosis not present

## 2021-02-21 DIAGNOSIS — I129 Hypertensive chronic kidney disease with stage 1 through stage 4 chronic kidney disease, or unspecified chronic kidney disease: Secondary | ICD-10-CM | POA: Diagnosis not present

## 2021-02-21 DIAGNOSIS — E785 Hyperlipidemia, unspecified: Secondary | ICD-10-CM | POA: Diagnosis not present

## 2021-02-21 DIAGNOSIS — M48061 Spinal stenosis, lumbar region without neurogenic claudication: Secondary | ICD-10-CM | POA: Diagnosis not present

## 2021-02-21 DIAGNOSIS — I491 Atrial premature depolarization: Secondary | ICD-10-CM | POA: Diagnosis not present

## 2021-02-21 DIAGNOSIS — M5416 Radiculopathy, lumbar region: Secondary | ICD-10-CM | POA: Diagnosis not present

## 2021-02-21 DIAGNOSIS — R269 Unspecified abnormalities of gait and mobility: Secondary | ICD-10-CM | POA: Diagnosis not present

## 2021-02-21 DIAGNOSIS — I10 Essential (primary) hypertension: Secondary | ICD-10-CM | POA: Diagnosis not present

## 2021-02-21 DIAGNOSIS — K219 Gastro-esophageal reflux disease without esophagitis: Secondary | ICD-10-CM | POA: Diagnosis not present

## 2021-02-21 DIAGNOSIS — W1839XA Other fall on same level, initial encounter: Secondary | ICD-10-CM | POA: Diagnosis not present

## 2021-02-21 DIAGNOSIS — Z79899 Other long term (current) drug therapy: Secondary | ICD-10-CM | POA: Diagnosis not present

## 2021-02-21 DIAGNOSIS — I959 Hypotension, unspecified: Secondary | ICD-10-CM | POA: Diagnosis not present

## 2021-02-21 DIAGNOSIS — Z7901 Long term (current) use of anticoagulants: Secondary | ICD-10-CM | POA: Diagnosis not present

## 2021-02-21 DIAGNOSIS — M19041 Primary osteoarthritis, right hand: Secondary | ICD-10-CM | POA: Diagnosis not present

## 2021-02-21 DIAGNOSIS — W19XXXA Unspecified fall, initial encounter: Secondary | ICD-10-CM | POA: Diagnosis not present

## 2021-02-21 DIAGNOSIS — Y998 Other external cause status: Secondary | ICD-10-CM | POA: Diagnosis not present

## 2021-02-21 DIAGNOSIS — R52 Pain, unspecified: Secondary | ICD-10-CM | POA: Diagnosis not present

## 2021-02-21 DIAGNOSIS — M1612 Unilateral primary osteoarthritis, left hip: Secondary | ICD-10-CM | POA: Diagnosis not present

## 2021-02-21 DIAGNOSIS — S72035A Nondisplaced midcervical fracture of left femur, initial encounter for closed fracture: Secondary | ICD-10-CM | POA: Diagnosis not present

## 2021-02-21 DIAGNOSIS — F5101 Primary insomnia: Secondary | ICD-10-CM | POA: Diagnosis not present

## 2021-02-21 DIAGNOSIS — E782 Mixed hyperlipidemia: Secondary | ICD-10-CM | POA: Diagnosis not present

## 2021-02-21 DIAGNOSIS — M19042 Primary osteoarthritis, left hand: Secondary | ICD-10-CM | POA: Diagnosis not present

## 2021-02-21 DIAGNOSIS — S299XXA Unspecified injury of thorax, initial encounter: Secondary | ICD-10-CM | POA: Diagnosis not present

## 2021-02-21 DIAGNOSIS — M25552 Pain in left hip: Secondary | ICD-10-CM | POA: Diagnosis not present

## 2021-02-21 DIAGNOSIS — F39 Unspecified mood [affective] disorder: Secondary | ICD-10-CM | POA: Diagnosis not present

## 2021-02-21 DIAGNOSIS — F3340 Major depressive disorder, recurrent, in remission, unspecified: Secondary | ICD-10-CM | POA: Diagnosis not present

## 2021-02-21 DIAGNOSIS — F419 Anxiety disorder, unspecified: Secondary | ICD-10-CM | POA: Diagnosis not present

## 2021-02-21 DIAGNOSIS — N3941 Urge incontinence: Secondary | ICD-10-CM | POA: Diagnosis not present

## 2021-02-21 DIAGNOSIS — I499 Cardiac arrhythmia, unspecified: Secondary | ICD-10-CM | POA: Diagnosis not present

## 2021-02-21 DIAGNOSIS — S72002A Fracture of unspecified part of neck of left femur, initial encounter for closed fracture: Secondary | ICD-10-CM | POA: Diagnosis not present

## 2021-02-21 DIAGNOSIS — Z20822 Contact with and (suspected) exposure to covid-19: Secondary | ICD-10-CM | POA: Diagnosis not present

## 2021-02-25 DIAGNOSIS — Z4801 Encounter for change or removal of surgical wound dressing: Secondary | ICD-10-CM | POA: Diagnosis not present

## 2021-02-25 DIAGNOSIS — S72002D Fracture of unspecified part of neck of left femur, subsequent encounter for closed fracture with routine healing: Secondary | ICD-10-CM | POA: Diagnosis not present

## 2021-02-25 DIAGNOSIS — R2689 Other abnormalities of gait and mobility: Secondary | ICD-10-CM | POA: Diagnosis not present

## 2021-02-25 DIAGNOSIS — I10 Essential (primary) hypertension: Secondary | ICD-10-CM | POA: Diagnosis not present

## 2021-03-02 DIAGNOSIS — S72002D Fracture of unspecified part of neck of left femur, subsequent encounter for closed fracture with routine healing: Secondary | ICD-10-CM | POA: Diagnosis not present

## 2021-03-02 DIAGNOSIS — R2689 Other abnormalities of gait and mobility: Secondary | ICD-10-CM | POA: Diagnosis not present

## 2021-03-02 DIAGNOSIS — Z4801 Encounter for change or removal of surgical wound dressing: Secondary | ICD-10-CM | POA: Diagnosis not present

## 2021-03-02 DIAGNOSIS — I10 Essential (primary) hypertension: Secondary | ICD-10-CM | POA: Diagnosis not present

## 2021-03-04 DIAGNOSIS — R2689 Other abnormalities of gait and mobility: Secondary | ICD-10-CM | POA: Diagnosis not present

## 2021-03-04 DIAGNOSIS — Z4801 Encounter for change or removal of surgical wound dressing: Secondary | ICD-10-CM | POA: Diagnosis not present

## 2021-03-04 DIAGNOSIS — S72002D Fracture of unspecified part of neck of left femur, subsequent encounter for closed fracture with routine healing: Secondary | ICD-10-CM | POA: Diagnosis not present

## 2021-03-04 DIAGNOSIS — I10 Essential (primary) hypertension: Secondary | ICD-10-CM | POA: Diagnosis not present

## 2021-03-09 DIAGNOSIS — S72002D Fracture of unspecified part of neck of left femur, subsequent encounter for closed fracture with routine healing: Secondary | ICD-10-CM | POA: Diagnosis not present

## 2021-03-09 DIAGNOSIS — R2689 Other abnormalities of gait and mobility: Secondary | ICD-10-CM | POA: Diagnosis not present

## 2021-03-09 DIAGNOSIS — I10 Essential (primary) hypertension: Secondary | ICD-10-CM | POA: Diagnosis not present

## 2021-03-09 DIAGNOSIS — Z4801 Encounter for change or removal of surgical wound dressing: Secondary | ICD-10-CM | POA: Diagnosis not present

## 2021-03-10 DIAGNOSIS — M25552 Pain in left hip: Secondary | ICD-10-CM | POA: Diagnosis not present

## 2021-03-11 DIAGNOSIS — S72002D Fracture of unspecified part of neck of left femur, subsequent encounter for closed fracture with routine healing: Secondary | ICD-10-CM | POA: Diagnosis not present

## 2021-03-11 DIAGNOSIS — Z4801 Encounter for change or removal of surgical wound dressing: Secondary | ICD-10-CM | POA: Diagnosis not present

## 2021-03-11 DIAGNOSIS — I10 Essential (primary) hypertension: Secondary | ICD-10-CM | POA: Diagnosis not present

## 2021-03-11 DIAGNOSIS — R2689 Other abnormalities of gait and mobility: Secondary | ICD-10-CM | POA: Diagnosis not present

## 2021-03-16 DIAGNOSIS — Z4801 Encounter for change or removal of surgical wound dressing: Secondary | ICD-10-CM | POA: Diagnosis not present

## 2021-03-16 DIAGNOSIS — R2689 Other abnormalities of gait and mobility: Secondary | ICD-10-CM | POA: Diagnosis not present

## 2021-03-16 DIAGNOSIS — S72002D Fracture of unspecified part of neck of left femur, subsequent encounter for closed fracture with routine healing: Secondary | ICD-10-CM | POA: Diagnosis not present

## 2021-03-16 DIAGNOSIS — I10 Essential (primary) hypertension: Secondary | ICD-10-CM | POA: Diagnosis not present

## 2021-03-23 DIAGNOSIS — S72002D Fracture of unspecified part of neck of left femur, subsequent encounter for closed fracture with routine healing: Secondary | ICD-10-CM | POA: Diagnosis not present

## 2021-03-23 DIAGNOSIS — R2689 Other abnormalities of gait and mobility: Secondary | ICD-10-CM | POA: Diagnosis not present

## 2021-03-23 DIAGNOSIS — I10 Essential (primary) hypertension: Secondary | ICD-10-CM | POA: Diagnosis not present

## 2021-03-23 DIAGNOSIS — Z4801 Encounter for change or removal of surgical wound dressing: Secondary | ICD-10-CM | POA: Diagnosis not present

## 2021-03-30 DIAGNOSIS — R2689 Other abnormalities of gait and mobility: Secondary | ICD-10-CM | POA: Diagnosis not present

## 2021-03-30 DIAGNOSIS — I10 Essential (primary) hypertension: Secondary | ICD-10-CM | POA: Diagnosis not present

## 2021-03-30 DIAGNOSIS — S72002D Fracture of unspecified part of neck of left femur, subsequent encounter for closed fracture with routine healing: Secondary | ICD-10-CM | POA: Diagnosis not present

## 2021-03-30 DIAGNOSIS — Z4801 Encounter for change or removal of surgical wound dressing: Secondary | ICD-10-CM | POA: Diagnosis not present

## 2021-04-07 DIAGNOSIS — M25552 Pain in left hip: Secondary | ICD-10-CM | POA: Diagnosis not present

## 2021-06-02 DIAGNOSIS — Z471 Aftercare following joint replacement surgery: Secondary | ICD-10-CM | POA: Diagnosis not present

## 2021-06-02 DIAGNOSIS — Z96642 Presence of left artificial hip joint: Secondary | ICD-10-CM | POA: Diagnosis not present

## 2021-06-08 ENCOUNTER — Ambulatory Visit (INDEPENDENT_AMBULATORY_CARE_PROVIDER_SITE_OTHER): Payer: Medicare Other | Admitting: Family Medicine

## 2021-06-08 ENCOUNTER — Other Ambulatory Visit: Payer: Self-pay

## 2021-06-08 ENCOUNTER — Encounter: Payer: Self-pay | Admitting: Family Medicine

## 2021-06-08 VITALS — BP 160/90 | Ht 64.0 in | Wt 145.6 lb

## 2021-06-08 DIAGNOSIS — I10 Essential (primary) hypertension: Secondary | ICD-10-CM | POA: Diagnosis not present

## 2021-06-08 DIAGNOSIS — E785 Hyperlipidemia, unspecified: Secondary | ICD-10-CM | POA: Insufficient documentation

## 2021-06-08 DIAGNOSIS — H6122 Impacted cerumen, left ear: Secondary | ICD-10-CM | POA: Diagnosis not present

## 2021-06-08 DIAGNOSIS — G479 Sleep disorder, unspecified: Secondary | ICD-10-CM | POA: Diagnosis not present

## 2021-06-08 DIAGNOSIS — E7800 Pure hypercholesterolemia, unspecified: Secondary | ICD-10-CM | POA: Diagnosis not present

## 2021-06-08 NOTE — Progress Notes (Signed)
ear   Provider:  Alain Honey, MD  Careteam: Patient Care Team: Terrance Mass, MD (Inactive) as Consulting Physician (Gynecology) Avie Echevaria., MD as Referring Physician (Sports Medicine)  PLACE OF SERVICE:  [place] Directive information Does Patient Have a Medical Advance Directive?: Yes, Type of Advance Directive: [place];Living will;Out of facility DNR (pink MOST or yellow form), Does patient want to make changes to medical advance directive?: No - Patient declined  Allergies  Allergen Reactions   Aspirin     Gastric ulcer   Ciprofloxacin     Sores in throat   Percocet [Oxycodone-Acetaminophen] Nausea Only   Tramadol     Stomach burning   Penicillins Rash    Chief Complaint  Patient presents with   New Patient (Initial Visit)    Patient presents today for patient appointment.     HPI: Patient is a 80 y.o. 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  She has several issues which she wants to discuss today including balance memory some lesions on her back.  She had hip surgery in August and has recovered from that. 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  Also is status post 2 knee replacements. Regarding her memory she really does not have a problem remembers 3 words at 5 minutes.  She already makes lists and writes on her calendar. The lesions on her back consistent with nevi or skin tags that get caught on her clothing at times  Review of Systems:  Review of Systems  Constitutional: Negative.   HENT:  Positive for hearing loss.   Respiratory: Negative.    Cardiovascular: Negative.   Gastrointestinal: Negative.   Genitourinary: Negative.   Skin: Negative.   Psychiatric/Behavioral:  Positive for memory loss. The patient has insomnia.   All other systems reviewed and are negative.  Past Medical History:   Diagnosis Date   Arthritis    DJD (degenerative joint disease) right hip   History of MRI    Hyperlipidemia    Hypertension    Lumbar radiculopathy 09/13/2014   Peptic ulceration    Sleep disturbance 06/03/2015   Trochanteric bursitis of right hip 09/13/2014   Past Surgical History:  Procedure Laterality Date   ANKLE SURGERY  1985   cartilage replaced   BREAST BIOPSY  1965   BREAST REDUCTION SURGERY Bilateral 1990   CATARACT EXTRACTION W/ INTRAOCULAR LENS  IMPLANT, BILATERAL  2004   CHOLECYSTECTOMY  2004   [place]ONOS[place]  07/14/2005   [place]   NASAL TURBINATE REDUCTION  1987 and 2015   TOTAL ABDOMINAL HYSTERECTOMY  1987   TOTAL HIP ARTHROPLASTY Right 02/08/2017   Dr. Alfredia Client   TOTAL KNEE ARTHROPLASTY Bilateral 2012 & 2014   [place] EXTRACTION  1970   Social History:   reports that she has never smoked. She has never used smokeless tobacco. She reports that she does not drink alcohol and does not use drugs.  Family History  Problem Relation Age of Onset   Hypertension Mother    Heart disease Father    Hypertension Father    Colon cancer Sister    Cancer Brother    Healthy Daughter    Healthy Daughter    Healthy Son     Medications: Patient's Medications  New Prescriptions   No medications  on file  Previous Medications   AMLODIPINE (NORVASC) 5 MG TABLET    Take 5 mg by mouth daily.   ATORVASTATIN (LIPITOR) 10 MG TABLET    Take 10 mg by mouth daily.   CALCIUM-MAGNESIUM-ZINC 333-133-5 MG TABS    Take by mouth. Take one tablet 3 times a day   DEXLANSOPRAZOLE (DEXILANT) 60 MG CAPSULE    Take 60 mg by mouth daily.   FERROUS SULFATE 325 (65 FE) MG TABLET    Take 325 mg by mouth. Take one tablet before breakfast   SERTRALINE (ZOLOFT) 100 MG TABLET    Take 100 mg by mouth daily.   THERAPEUTIC MULTIVITAMIN-MINERALS (THERAGRAN-M) TABLET    Take 1 tablet by mouth daily.   ZOLPIDEM (AMBIEN) 10 MG TABLET    Take 10 mg by mouth  at bedtime as needed for sleep.  Modified Medications   No medications on file  Discontinued Medications   No medications on file    Physical Exam:  Vitals:   06/08/21 0936  Weight: 145 lb 9.6 oz (66 kg)  Height: 5' 4" (1.626 m)   Body mass index is 24.99 kg/m. Wt Readings from Last 3 Encounters:  06/08/21 145 lb 9.6 oz (66 kg)  02/12/17 144 lb (65.3 kg)    Physical Exam Vitals and nursing note reviewed.  Constitutional:      Appearance: Normal appearance.  HENT:     Right Ear: Tympanic membrane normal.     Ears:     Comments: Increased cerumen in right EAC Cardiovascular:     Rate and Rhythm: Normal rate and regular rhythm.  Pulmonary:     Effort: Pulmonary effort is normal.     Breath sounds: Normal breath sounds.  Musculoskeletal:        General: Normal range of motion.  Neurological:     General: No focal deficit present.     Mental Status: She is alert and oriented to person, place, and time.    Labs reviewed: Basic Metabolic Panel: No results for input(s): NA, K, CL, CO2, GLUCOSE, BUN, CREATININE, CALCIUM, MG, PHOS, TSH in the last 8760 hours. Liver Function Tests: No results for input(s): AST, ALT, ALKPHOS, BILITOT, PROT, ALBUMIN in the last 8760 hours. No results for input(s): LIPASE, AMYLASE in the last 8760 hours. No results for input(s): AMMONIA in the last 8760 hours. CBC: No results for input(s): WBC, NEUTROABS, HGB, HCT, MCV, PLT in the last 8760 hours. Lipid Panel: No results for input(s): CHOL, HDL, LDLCALC, TRIG, CHOLHDL, LDLDIRECT in the last 8760 hours. TSH: No results for input(s): TSH in the last 8760 hours. A1C: No results found for: HGBA1C   Assessment/Plan  Impacted cerumen of left ear - Plan: Ear Lavage  Primary hypertension  Pure hypercholesterolemia - Plan: Lipid panel, CMP with eGFR([facility])  Sleep disturbance Continue with Ambien but she needs to sleep in room where her husband sleeps with his nocturia and fear of  falling  Alain Honey, MD [facility] [phone]

## 2021-06-08 NOTE — Patient Instructions (Signed)
May try Fibro cream for feet

## 2021-06-09 DIAGNOSIS — L299 Pruritus, unspecified: Secondary | ICD-10-CM | POA: Diagnosis not present

## 2021-06-09 DIAGNOSIS — H6122 Impacted cerumen, left ear: Secondary | ICD-10-CM | POA: Diagnosis not present

## 2021-06-09 DIAGNOSIS — H938X2 Other specified disorders of left ear: Secondary | ICD-10-CM | POA: Diagnosis not present

## 2021-06-09 DIAGNOSIS — H9202 Otalgia, left ear: Secondary | ICD-10-CM | POA: Diagnosis not present

## 2021-06-09 NOTE — Addendum Note (Signed)
Addended by: Cheron Every C on: 06/09/2021 10:32 AM   Modules accepted: Orders

## 2021-06-10 ENCOUNTER — Other Ambulatory Visit: Payer: Medicare Other

## 2021-06-10 DIAGNOSIS — E7800 Pure hypercholesterolemia, unspecified: Secondary | ICD-10-CM

## 2021-06-13 ENCOUNTER — Other Ambulatory Visit: Payer: Medicare Other

## 2021-06-13 ENCOUNTER — Other Ambulatory Visit: Payer: Self-pay

## 2021-06-13 DIAGNOSIS — E7800 Pure hypercholesterolemia, unspecified: Secondary | ICD-10-CM | POA: Diagnosis not present

## 2021-06-13 LAB — COMPLETE METABOLIC PANEL WITH GFR
AG Ratio: 1.6 (calc) (ref 1.0–2.5)
ALT: 15 U/L (ref 6–29)
AST: 22 U/L (ref 10–35)
Albumin: 4.1 g/dL (ref 3.6–5.1)
Alkaline phosphatase (APISO): 81 U/L (ref 37–153)
BUN: 18 mg/dL (ref 7–25)
CO2: 27 mmol/L (ref 20–32)
Calcium: 9.8 mg/dL (ref 8.6–10.4)
Chloride: 107 mmol/L (ref 98–110)
Creat: 0.94 mg/dL (ref 0.60–1.00)
Globulin: 2.5 g/dL (calc) (ref 1.9–3.7)
Glucose, Bld: 122 mg/dL (ref 65–139)
Potassium: 4.4 mmol/L (ref 3.5–5.3)
Sodium: 141 mmol/L (ref 135–146)
Total Bilirubin: 0.6 mg/dL (ref 0.2–1.2)
Total Protein: 6.6 g/dL (ref 6.1–8.1)
eGFR: 62 mL/min/{1.73_m2} (ref >=60)

## 2021-06-13 LAB — LIPID PANEL
Cholesterol: 213 mg/dL — ABNORMAL HIGH (ref <200)
HDL: 93 mg/dL (ref >=50)
LDL Cholesterol (Calc): 103 mg/dL (calc) — ABNORMAL HIGH
Non-HDL Cholesterol (Calc): 120 mg/dL (calc) (ref <130)
Total CHOL/HDL Ratio: 2.3 (calc) (ref <5.0)
Triglycerides: 81 mg/dL (ref <150)

## 2021-08-02 ENCOUNTER — Ambulatory Visit (INDEPENDENT_AMBULATORY_CARE_PROVIDER_SITE_OTHER): Payer: Medicare Other | Admitting: Family Medicine

## 2021-08-02 ENCOUNTER — Other Ambulatory Visit: Payer: Self-pay

## 2021-08-02 ENCOUNTER — Encounter: Payer: Self-pay | Admitting: Family Medicine

## 2021-08-02 VITALS — BP 142/80 | HR 75 | Temp 98.4°F | Ht 64.0 in | Wt 147.8 lb

## 2021-08-02 DIAGNOSIS — M545 Low back pain, unspecified: Secondary | ICD-10-CM

## 2021-08-02 DIAGNOSIS — N3000 Acute cystitis without hematuria: Secondary | ICD-10-CM

## 2021-08-02 DIAGNOSIS — R350 Frequency of micturition: Secondary | ICD-10-CM | POA: Diagnosis not present

## 2021-08-02 LAB — POCT URINALYSIS DIPSTICK
Bilirubin, UA: NEGATIVE
Glucose, UA: NEGATIVE
Ketones, UA: NEGATIVE
Nitrite, UA: POSITIVE
Protein, UA: POSITIVE — AB
Spec Grav, UA: 1.01 (ref 1.010–1.025)
Urobilinogen, UA: 0.2 E.U./dL
pH, UA: 8 (ref 5.0–8.0)

## 2021-08-02 MED ORDER — NITROFURANTOIN MONOHYD MACRO 100 MG PO CAPS: 100.0000 mg | ORAL_CAPSULE | Freq: Two times a day (BID) | ORAL | 0 refills | Status: DC

## 2021-08-02 NOTE — Progress Notes (Signed)
Provider:  Jacalyn Lefevre, MD  Careteam: Patient Care Team: Frederica Kuster, MD as PCP - General (Family Medicine) Ok Edwards, MD (Inactive) as Consulting Physician (Gynecology) Verlin Dike., MD as Referring Physician (Sports Medicine)  PLACE OF SERVICE:  [facility]  Advanced Directive information    Allergies  Allergen Reactions   Aspirin     Gastric ulcer   Ciprofloxacin     Sores in throat   Percocet [Oxycodone-Acetaminophen] Nausea Only   Tramadol     Stomach burning   Penicillins Rash    No chief complaint on file.    HPI: Patient is a 81 y.o. female   Review of Systems:  Review of Systems  Constitutional: Negative.   Respiratory: Negative.    Cardiovascular: Negative.   Genitourinary:  Positive for dysuria, frequency and urgency.  All other systems reviewed and are negative. Patient presents today with chief complaint of frequent urination, odor in urine and left-sided back pain.  Symptoms have been present now for some time.  She describes nocturia x6.  She also describes some urinary urgency suggestive of urge incontinence. Past Medical History:  Diagnosis Date   Arthritis    DJD (degenerative joint disease) right hip   History of MRI    Hyperlipidemia    Hypertension    Lumbar radiculopathy 09/13/2014   Peptic ulceration    Sleep disturbance 06/03/2015   Trochanteric bursitis of right hip 09/13/2014   Past Surgical History:  Procedure Laterality Date   ANKLE SURGERY  1985   cartilage replaced   BREAST BIOPSY  1965   BREAST REDUCTION SURGERY Bilateral 1990   CATARACT EXTRACTION W/ INTRAOCULAR LENS  IMPLANT, BILATERAL  2004   CHOLECYSTECTOMY  2004   COLONOSCOPY  07/14/2005   KIDNEY SURGERY  1962   NASAL TURBINATE REDUCTION  1987 and 2015   TOTAL ABDOMINAL HYSTERECTOMY  1987   TOTAL HIP ARTHROPLASTY Right 02/08/2017   Dr. Louis Matte   TOTAL KNEE ARTHROPLASTY Bilateral 2012 & 2014   TUBAL LIGATION  1975   WISDOM TOOTH  EXTRACTION  1970   Social History:   reports that she has never smoked. She has never used smokeless tobacco. She reports that she does not drink alcohol and does not use drugs.  Family History  Problem Relation Age of Onset   Hypertension Mother    Heart disease Father    Hypertension Father    Colon cancer Sister    Cancer Brother    Healthy Daughter    Healthy Daughter    Healthy Son     Medications: Patient's Medications  New Prescriptions   No medications on file  Previous Medications   AMLODIPINE (NORVASC) 5 MG TABLET    Take 5 mg by mouth daily.   ATORVASTATIN (LIPITOR) 10 MG TABLET    Take 10 mg by mouth daily.   CALCIUM-MAGNESIUM-ZINC 333-133-5 MG TABS    Take by mouth. Take one tablet 3 times a day   DEXLANSOPRAZOLE (DEXILANT) 60 MG CAPSULE    Take 60 mg by mouth daily.   FERROUS SULFATE 325 (65 FE) MG TABLET    Take 325 mg by mouth. Take one tablet before breakfast   SERTRALINE (ZOLOFT) 100 MG TABLET    Take 100 mg by mouth daily.   THERAPEUTIC MULTIVITAMIN-MINERALS (THERAGRAN-M) TABLET    Take 1 tablet by mouth daily.   ZOLPIDEM (AMBIEN) 10 MG TABLET    Take 10 mg by mouth at bedtime as needed for  sleep.  Modified Medications   No medications on file  Discontinued Medications   No medications on file    Physical Exam:  There were no vitals filed for this visit. There is no height or weight on file to calculate BMI. Wt Readings from Last 3 Encounters:  06/08/21 145 lb 9.6 oz (66 kg)  02/12/17 144 lb (65.3 kg)    Physical Exam Vitals and nursing note reviewed.  Constitutional:      Appearance: Normal appearance.  Cardiovascular:     Rate and Rhythm: Normal rate and regular rhythm.  Pulmonary:     Effort: Pulmonary effort is normal.     Breath sounds: Normal breath sounds.  Abdominal:     General: Abdomen is flat. Bowel sounds are normal.     Palpations: Abdomen is soft.  Skin:    Comments: Seborrheic keratosis on lower back were treated with liquid  nitrogen at her last visit and did not completely resolve.  These were treated again today with the same  Neurological:     General: No focal deficit present.     Mental Status: She is alert and oriented to person, place, and time.  Psychiatric:        Mood and Affect: Mood normal.    Labs reviewed: Basic Metabolic Panel: Recent Labs    06/13/21 1033  NA 141  K 4.4  CL 107  CO2 27  GLUCOSE 122  BUN 18  CREATININE 0.94  CALCIUM 9.8   Liver Function Tests: Recent Labs    06/13/21 1033  AST 22  ALT 15  BILITOT 0.6  PROT 6.6   No results for input(s): LIPASE, AMYLASE in the last 8760 hours. No results for input(s): AMMONIA in the last 8760 hours. CBC: No results for input(s): WBC, NEUTROABS, HGB, HCT, MCV, PLT in the last 8760 hours. Lipid Panel: Recent Labs    06/13/21 1033  CHOL 213*  HDL 93  LDLCALC 103*  TRIG 81  CHOLHDL 2.3   TSH: No results for input(s): TSH in the last 8760 hours. A1C: No results found for: HGBA1C   Assessment/Plan  1. Frequent urination Dip urine suggest possible infection we will culture and begin Macrobid pending results of culture and sensitivity  2. Low back pain, unspecified back pain laterality, unspecified chronicity, unspecified whether sciatica present Likely related to urinary infection     Jacalyn Lefevre, MD [facility] [phone]

## 2021-08-03 NOTE — Telephone Encounter (Signed)
Note from daughter posted to patient's chart. [employer].

## 2021-08-05 LAB — URINE CULTURE
MICRO NUMBER:: 12944083
SPECIMEN QUALITY:: ADEQUATE

## 2021-09-02 ENCOUNTER — Telehealth: Payer: Self-pay

## 2021-09-02 ENCOUNTER — Encounter: Payer: Self-pay | Admitting: Family Medicine

## 2021-09-02 DIAGNOSIS — R32 Unspecified urinary incontinence: Secondary | ICD-10-CM

## 2021-09-02 DIAGNOSIS — N3281 Overactive bladder: Secondary | ICD-10-CM

## 2021-09-02 MED ORDER — MIRABEGRON ER 50 MG PO TB24: 50.0000 mg | ORAL_TABLET | Freq: Every day | ORAL | 0 refills | Status: DC

## 2021-09-02 NOTE — Telephone Encounter (Signed)
As previously noted there is no documentation. Please advise if you are willing to provide Rx myrbetriq or if this needs to wait till Tuesday when Dr. Hyacinth Meeker is back in office.  ?

## 2021-09-02 NOTE — Telephone Encounter (Signed)
I have reviewed dr.Miller's recent visit you were treated for urinary tract infection with Macrobid. ?Any history of over active bladder ? Myrbetrq for Overactive bladder. I don't see on the note any orders for Myrbetriq.  ?

## 2021-09-02 NOTE — Telephone Encounter (Signed)
Dr. Hyacinth Meeker is out of office and message will be forwarded to covering provider  ?

## 2021-09-02 NOTE — Telephone Encounter (Signed)
Spoke with Dr.Miller and he okay medication to be send into pharmacy. Medication was send into pharmacy. ?

## 2021-10-09 ENCOUNTER — Encounter: Payer: Self-pay | Admitting: Family Medicine

## 2021-11-07 DIAGNOSIS — H5203 Hypermetropia, bilateral: Secondary | ICD-10-CM | POA: Diagnosis not present

## 2021-11-15 DIAGNOSIS — L603 Nail dystrophy: Secondary | ICD-10-CM | POA: Diagnosis not present

## 2021-12-04 ENCOUNTER — Encounter: Payer: Self-pay | Admitting: Family Medicine

## 2021-12-04 DIAGNOSIS — R32 Unspecified urinary incontinence: Secondary | ICD-10-CM

## 2021-12-04 DIAGNOSIS — N3281 Overactive bladder: Secondary | ICD-10-CM

## 2021-12-05 MED ORDER — MIRABEGRON ER 50 MG PO TB24: 50.0000 mg | ORAL_TABLET | Freq: Every day | ORAL | 1 refills | Status: DC

## 2022-01-25 ENCOUNTER — Ambulatory Visit (INDEPENDENT_AMBULATORY_CARE_PROVIDER_SITE_OTHER): Payer: Medicare Other | Admitting: Adult Health

## 2022-01-25 ENCOUNTER — Encounter: Payer: Self-pay | Admitting: Adult Health

## 2022-01-25 VITALS — BP 136/78 | HR 74 | Temp 97.7°F | Resp 18 | Ht 64.0 in | Wt 148.0 lb

## 2022-01-25 DIAGNOSIS — M5416 Radiculopathy, lumbar region: Secondary | ICD-10-CM | POA: Diagnosis not present

## 2022-01-25 DIAGNOSIS — M545 Low back pain, unspecified: Secondary | ICD-10-CM

## 2022-01-25 DIAGNOSIS — N3000 Acute cystitis without hematuria: Secondary | ICD-10-CM | POA: Diagnosis not present

## 2022-01-25 DIAGNOSIS — G8929 Other chronic pain: Secondary | ICD-10-CM

## 2022-01-25 DIAGNOSIS — R350 Frequency of micturition: Secondary | ICD-10-CM | POA: Diagnosis not present

## 2022-01-25 LAB — POCT URINALYSIS DIPSTICK
Bilirubin, UA: POSITIVE
Glucose, UA: NEGATIVE
Ketones, UA: NEGATIVE
Nitrite, UA: NEGATIVE
Odor: NORMAL
Protein, UA: NEGATIVE
Spec Grav, UA: 1.02 (ref 1.010–1.025)
Urobilinogen, UA: 2 E.U./dL — AB
pH, UA: 6 (ref 5.0–8.0)

## 2022-01-25 MED ORDER — NITROFURANTOIN MONOHYD MACRO 100 MG PO CAPS: 100.0000 mg | ORAL_CAPSULE | Freq: Two times a day (BID) | ORAL | 0 refills | Status: AC

## 2022-01-25 NOTE — Patient Instructions (Signed)
Urinary Tract Infection, Adult  A urinary tract infection (UTI) is an infection of any part of the urinary tract. The urinary tract includes the kidneys, ureters, bladder, and urethra. These organs make, store, and get rid of urine in the body. An upper UTI affects the ureters and kidneys. A lower UTI affects the bladder and urethra. What are the causes? Most urinary tract infections are caused by bacteria in your genital area around your urethra, where urine leaves your body. These bacteria grow and cause inflammation of your urinary tract. What increases the risk? You are more likely to develop this condition if: You have a urinary catheter that stays in place. You are not able to control when you urinate or have a bowel movement (incontinence). You are female and you: Use a spermicide or diaphragm for birth control. Have low estrogen levels. Are pregnant. You have certain genes that increase your risk. You are sexually active. You take antibiotic medicines. You have a condition that causes your flow of urine to slow down, such as: An enlarged prostate, if you are female. Blockage in your urethra. A kidney stone. A nerve condition that affects your bladder control (neurogenic bladder). Not getting enough to drink, or not urinating often. You have certain medical conditions, such as: Diabetes. A weak disease-fighting system (immunesystem). Sickle cell disease. Gout. Spinal cord injury. What are the signs or symptoms? Symptoms of this condition include: Needing to urinate right away (urgency). Frequent urination. This may include small amounts of urine each time you urinate. Pain or burning with urination. Blood in the urine. Urine that smells bad or unusual. Trouble urinating. Cloudy urine. Vaginal discharge, if you are female. Pain in the abdomen or the lower back. You may also have: Vomiting or a decreased appetite. Confusion. Irritability or tiredness. A fever or  chills. Diarrhea. The first symptom in older adults may be confusion. In some cases, they may not have any symptoms until the infection has worsened. How is this diagnosed? This condition is diagnosed based on your medical history and a physical exam. You may also have other tests, including: Urine tests. Blood tests. Tests for STIs (sexually transmitted infections). If you have had more than one UTI, a cystoscopy or imaging studies may be done to determine the cause of the infections. How is this treated? Treatment for this condition includes: Antibiotic medicine. Over-the-counter medicines to treat discomfort. Drinking enough water to stay hydrated. If you have frequent infections or have other conditions such as a kidney stone, you may need to see a health care provider who specializes in the urinary tract (urologist). In rare cases, urinary tract infections can cause sepsis. Sepsis is a life-threatening condition that occurs when the body responds to an infection. Sepsis is treated in the hospital with IV antibiotics, fluids, and other medicines. Follow these instructions at home:  Medicines Take over-the-counter and prescription medicines only as told by your health care provider. If you were prescribed an antibiotic medicine, take it as told by your health care provider. Do not stop using the antibiotic even if you start to feel better. General instructions Make sure you: Empty your bladder often and completely. Do not hold urine for long periods of time. Empty your bladder after sex. Wipe from front to back after urinating or having a bowel movement if you are female. Use each tissue only one time when you wipe. Drink enough fluid to keep your urine pale yellow. Keep all follow-up visits. This is important. Contact a health   care provider if: Your symptoms do not get better after 1-2 days. Your symptoms go away and then return. Get help right away if: You have severe pain in  your back or your lower abdomen. You have a fever or chills. You have nausea or vomiting. Summary A urinary tract infection (UTI) is an infection of any part of the urinary tract, which includes the kidneys, ureters, bladder, and urethra. Most urinary tract infections are caused by bacteria in your genital area. Treatment for this condition often includes antibiotic medicines. If you were prescribed an antibiotic medicine, take it as told by your health care provider. Do not stop using the antibiotic even if you start to feel better. Keep all follow-up visits. This is important. This information is not intended to replace advice given to you by your health care provider. Make sure you discuss any questions you have with your health care provider. Document Revised: 01/30/2020 Document Reviewed: 01/30/2020 Elsevier Patient Education  2023 Elsevier Inc.  

## 2022-01-25 NOTE — Progress Notes (Signed)
[facility] clinic  Provider:   Kenard Gower  DNP  Code Status:  Full Code  Goals of Care:     06/08/2021    9:43 AM  Advanced Directives  Does Patient Have a Medical Advance Directive? Yes  Type of Estate agent of [place];Living will;Out of facility DNR (pink MOST or yellow form)  Does patient want to make changes to medical advance directive? No - Patient declined     Chief Complaint  Patient presents with   Acute Visit    Patient is here for possible UTI and lower back pain    HPI: Patient is a 81 y.o. female seen today for an acute visit for a possible UTI. She complains of right lower back pain, 5/10, and strong foul urine smell. She attributes the urine smell to the medications that she is taking. Urine dipstick showed Large (+++) leukocytes, nitrite negative, blood hemolyzed trace, cloudy color. She has increase in urinary incontinence and urge to urinate. She drinks 64 oz of water daily. She stated that she has seen an orthopedic before for her lower back pain and does not feel like she needs surgery. She stated that she has pain when she mows the lawn. She walks upright, no limping and no edema noted.    Past Medical History:  Diagnosis Date   Arthritis    DJD (degenerative joint disease) right hip   History of MRI    Hyperlipidemia    Hypertension    Lumbar radiculopathy 09/13/2014   Peptic ulceration    Sleep disturbance 06/03/2015   Trochanteric bursitis of right hip 09/13/2014    Past Surgical History:  Procedure Laterality Date   ANKLE SURGERY  1985   cartilage replaced   BREAST BIOPSY  1965   BREAST REDUCTION SURGERY Bilateral 1990   CATARACT EXTRACTION W/ INTRAOCULAR LENS  IMPLANT, BILATERAL  2004   CHOLECYSTECTOMY  2004   COLONOSCOPY  07/14/2005   KIDNEY SURGERY  1962   NASAL TURBINATE REDUCTION  1987 and 2015   TOTAL ABDOMINAL HYSTERECTOMY  1987   TOTAL HIP ARTHROPLASTY Right 02/08/2017   Dr. Louis Matte   TOTAL KNEE  ARTHROPLASTY Bilateral 2012 & 2014   TUBAL LIGATION  1975   WISDOM TOOTH EXTRACTION  1970    Allergies  Allergen Reactions   Aspirin     Gastric ulcer   Ciprofloxacin     Sores in throat   Percocet [Oxycodone-Acetaminophen] Nausea Only   Tramadol     Stomach burning   Penicillins Rash    Outpatient Encounter Medications as of 01/25/2022  Medication Sig   amLODipine (NORVASC) 5 MG tablet Take 5 mg by mouth daily.   atorvastatin (LIPITOR) 10 MG tablet Take 10 mg by mouth daily.   Calcium-Magnesium-Zinc 333-133-5 MG TABS Take by mouth. Take one tablet 3 times a day   mirabegron ER (MYRBETRIQ) 50 MG TB24 tablet Take 1 tablet (50 mg total) by mouth daily.   sertraline (ZOLOFT) 100 MG tablet Take 100 mg by mouth daily.   therapeutic multivitamin-minerals (THERAGRAN-M) tablet Take 1 tablet by mouth daily.   zolpidem (AMBIEN) 10 MG tablet Take 10 mg by mouth at bedtime as needed for sleep.   [DISCONTINUED] ferrous sulfate 325 (65 FE) MG tablet Take 325 mg by mouth. Take one tablet before breakfast   [DISCONTINUED] nitrofurantoin, macrocrystal-monohydrate, (MACROBID) 100 MG capsule Take 1 capsule (100 mg total) by mouth 2 (two) times daily.   [DISCONTINUED] dexlansoprazole (DEXILANT) 60 MG capsule Take  60 mg by mouth daily.   No facility-administered encounter medications on file as of 01/25/2022.    Review of Systems:  Review of Systems  Constitutional:  Negative for appetite change, chills, fatigue and fever.  HENT:  Negative for congestion, hearing loss, rhinorrhea and sore throat.   Eyes: Negative.   Respiratory:  Negative for cough, shortness of breath and wheezing.   Cardiovascular:  Negative for chest pain, palpitations and leg swelling.  Gastrointestinal:  Negative for abdominal pain, constipation, diarrhea, nausea and vomiting.  Genitourinary:  Positive for flank pain, frequency and urgency. Negative for dysuria, hematuria and vaginal pain.  Musculoskeletal:  Positive for back  pain. Negative for arthralgias, joint swelling and myalgias.  Skin:  Negative for color change, rash and wound.  Neurological:  Negative for dizziness, weakness and headaches.  Psychiatric/Behavioral:  Negative for behavioral problems. The patient is not nervous/anxious.     Health Maintenance  Topic Date Due   Zoster Vaccines- Shingrix (1 of 2) Never done   COVID-19 Vaccine (4 - Booster for [employer] series) 07/10/2021   TETANUS/TDAP  09/24/2021   INFLUENZA VACCINE  01/31/2022   Pneumonia Vaccine 31+ Years old  Completed   DEXA SCAN  Completed   HPV VACCINES  Aged Out    Physical Exam: Vitals:   01/25/22 1521  BP: 136/78  Pulse: 74  Resp: 18  Temp: 97.7 F (36.5 C)  SpO2: 98%  Weight: 148 lb (67.1 kg)  Height: 5\' 4"  (1.626 m)   Body mass index is 25.4 kg/m. Physical Exam Constitutional:      General: She is not in acute distress.    Appearance: Normal appearance.  HENT:     Head: Normocephalic and atraumatic.     Nose: Nose normal.     Mouth/Throat:     Mouth: Mucous membranes are moist.  Eyes:     Conjunctiva/sclera: Conjunctivae normal.  Cardiovascular:     Rate and Rhythm: Normal rate and regular rhythm.  Pulmonary:     Effort: Pulmonary effort is normal.     Breath sounds: Normal breath sounds.  Abdominal:     General: Bowel sounds are normal.     Palpations: Abdomen is soft.  Musculoskeletal:        General: No swelling or tenderness. Normal range of motion.     Cervical back: Normal range of motion.  Skin:    General: Skin is warm and dry.  Neurological:     General: No focal deficit present.     Mental Status: She is alert and oriented to person, place, and time.  Psychiatric:        Mood and Affect: Mood normal.        Behavior: Behavior normal.        Thought Content: Thought content normal.        Judgment: Judgment normal.     Labs reviewed: Basic Metabolic Panel: Recent Labs    06/13/21 1033  NA 141  K 4.4  CL 107  CO2 27  GLUCOSE  122  BUN 18  CREATININE 0.94  CALCIUM 9.8   Liver Function Tests: Recent Labs    06/13/21 1033  AST 22  ALT 15  BILITOT 0.6  PROT 6.6   No results for input(s): "LIPASE", "AMYLASE" in the last 8760 hours. No results for input(s): "AMMONIA" in the last 8760 hours. CBC: No results for input(s): "WBC", "NEUTROABS", "HGB", "HCT", "MCV", "PLT" in the last 8760 hours. Lipid Panel: Recent Labs  06/13/21 1033  CHOL 213*  HDL 93  LDLCALC 103*  TRIG 81  CHOLHDL 2.3   No results found for: "HGBA1C"  Procedures since last visit: No results found.  Assessment/Plan  1. Acute cystitis without hematuria Frequency of micturition - POC Urinalysis Dipstick - Urinalysis -  discussed with patient that UTI can cause backache  -  after the antibiotic treatment and she is still having backache then she will need to come back to clinic for further assessment - nitrofurantoin, macrocrystal-monohydrate, (MACROBID) 100 MG capsule; Take 1 capsule (100 mg total) by mouth 2 (two) times daily for 7 days.  Dispense: 14 capsule; Refill: 0   3. Chronic right-sided low back pain without sciatica -  discussed with patient that UTI can cause backache  -  after the antibiotic treatment and she is still having backache then she will need to come back to clinic for further assessment   Labs/tests ordered:  POC Urinalysis Dipstick - Urinalysis  Next appt:  Visit date not found

## 2022-01-26 LAB — URINALYSIS
Bilirubin Urine: NEGATIVE
Glucose, UA: NEGATIVE
Hgb urine dipstick: NEGATIVE
Ketones, ur: NEGATIVE
Nitrite: NEGATIVE
Protein, ur: NEGATIVE
Specific Gravity, Urine: 1.008 (ref 1.001–1.035)
pH: 6 (ref 5.0–8.0)

## 2022-01-27 NOTE — Progress Notes (Signed)
How is her back pain? Is it getting better after taking the antibiotics? If still painful then we can send order for x-ray of lumbar spine if she wishes to.

## 2022-02-03 ENCOUNTER — Encounter: Payer: Self-pay | Admitting: Family Medicine

## 2022-02-15 ENCOUNTER — Encounter: Payer: Self-pay | Admitting: Family Medicine

## 2022-02-15 ENCOUNTER — Ambulatory Visit (INDEPENDENT_AMBULATORY_CARE_PROVIDER_SITE_OTHER): Payer: Medicare Other | Admitting: Family Medicine

## 2022-02-15 VITALS — BP 122/78 | HR 62 | Temp 98.2°F | Ht 64.0 in | Wt 144.4 lb

## 2022-02-15 DIAGNOSIS — N3000 Acute cystitis without hematuria: Secondary | ICD-10-CM

## 2022-02-15 DIAGNOSIS — R300 Dysuria: Secondary | ICD-10-CM

## 2022-02-15 DIAGNOSIS — I10 Essential (primary) hypertension: Secondary | ICD-10-CM | POA: Diagnosis not present

## 2022-02-15 DIAGNOSIS — G629 Polyneuropathy, unspecified: Secondary | ICD-10-CM | POA: Diagnosis not present

## 2022-02-15 LAB — POCT URINALYSIS DIPSTICK
Bilirubin, UA: NEGATIVE
Glucose, UA: NEGATIVE
Ketones, UA: NEGATIVE
Nitrite, UA: NEGATIVE
Protein, UA: POSITIVE — AB
Spec Grav, UA: 1.015 (ref 1.010–1.025)
Urobilinogen, UA: 0.2 E.U./dL
pH, UA: 7.5 (ref 5.0–8.0)

## 2022-02-15 MED ORDER — GABAPENTIN 100 MG PO CAPS: 100.0000 mg | ORAL_CAPSULE | Freq: Three times a day (TID) | ORAL | 3 refills | Status: AC

## 2022-02-15 MED ORDER — ATORVASTATIN CALCIUM 10 MG PO TABS
10.0000 mg | ORAL_TABLET | Freq: Every day | ORAL | 3 refills | Status: DC
Start: 2022-02-15 — End: 2023-09-29

## 2022-02-15 MED ORDER — SERTRALINE HCL 100 MG PO TABS: 100.0000 mg | ORAL_TABLET | Freq: Every day | ORAL | 1 refills | Status: DC

## 2022-02-15 MED ORDER — FAMOTIDINE 40 MG PO TABS
40.0000 mg | ORAL_TABLET | Freq: Every day | ORAL | 3 refills | Status: AC
Start: 2022-02-15 — End: ?

## 2022-02-15 MED ORDER — AMLODIPINE BESYLATE 5 MG PO TABS: 5.0000 mg | ORAL_TABLET | Freq: Every day | ORAL | 3 refills | Status: AC

## 2022-02-15 MED ORDER — CEPHALEXIN 250 MG PO CAPS: 250.0000 mg | ORAL_CAPSULE | Freq: Two times a day (BID) | ORAL | 0 refills | Status: DC

## 2022-02-15 NOTE — Progress Notes (Signed)
Provider:  Jacalyn Lefevre, MD  Careteam: Patient Care Team: Frederica Kuster, MD as PCP - General (Family Medicine) Ok Edwards, MD (Inactive) as Consulting Physician (Gynecology) Verlin Dike., MD as Referring Physician (Sports Medicine)  PLACE OF SERVICE:  [facility]  Advanced Directive information    Allergies  Allergen Reactions   Aspirin     Gastric ulcer   Ciprofloxacin     Sores in throat   Percocet [Oxycodone-Acetaminophen] Nausea Only   Tramadol     Stomach burning   Penicillins Rash    Chief Complaint  Patient presents with   Acute Visit    Patient presents today for right lower back back. She also reports burning with urination.     HPI: Patient is a 81 y.o. female patient complains of dysuria and frequency.  These have been common complaints in recent months.  Had one positive culture back in January which grew out E. coli.  Also has bilateral lower back pain as well as a history of musculoskeletal back pain.  Denies nausea vomiting fever chills.  Review of Systems:  Review of Systems  Constitutional:  Positive for malaise/fatigue. Negative for chills and fever.  Respiratory: Negative.    Cardiovascular: Negative.   Genitourinary:  Positive for dysuria and frequency.  All other systems reviewed and are negative.   Past Medical History:  Diagnosis Date   Arthritis    DJD (degenerative joint disease) right hip   History of MRI    Hyperlipidemia    Hypertension    Lumbar radiculopathy 09/13/2014   Peptic ulceration    Sleep disturbance 06/03/2015   Trochanteric bursitis of right hip 09/13/2014   Past Surgical History:  Procedure Laterality Date   ANKLE SURGERY  1985   cartilage replaced   BREAST BIOPSY  1965   BREAST REDUCTION SURGERY Bilateral 1990   CATARACT EXTRACTION W/ INTRAOCULAR LENS  IMPLANT, BILATERAL  2004   CHOLECYSTECTOMY  2004   COLONOSCOPY  07/14/2005   KIDNEY SURGERY  1962   NASAL TURBINATE REDUCTION  1987 and  2015   TOTAL ABDOMINAL HYSTERECTOMY  1987   TOTAL HIP ARTHROPLASTY Right 02/08/2017   Dr. Louis Matte   TOTAL KNEE ARTHROPLASTY Bilateral 2012 & 2014   TUBAL LIGATION  1975   WISDOM TOOTH EXTRACTION  1970   Social History:   reports that she has never smoked. She has never used smokeless tobacco. She reports that she does not drink alcohol and does not use drugs.  Family History  Problem Relation Age of Onset   Hypertension Mother    Heart disease Father    Hypertension Father    Colon cancer Sister    Cancer Brother    Healthy Daughter    Healthy Daughter    Healthy Son     Medications: Patient's Medications  New Prescriptions   CEPHALEXIN (KEFLEX) 250 MG CAPSULE    Take 1 capsule (250 mg total) by mouth 2 (two) times daily.   GABAPENTIN (NEURONTIN) 100 MG CAPSULE    Take 1 capsule (100 mg total) by mouth 3 (three) times daily.  Previous Medications   CALCIUM-MAGNESIUM-ZINC 333-133-5 MG TABS    Take by mouth. Take one tablet 3 times a day   MIRABEGRON ER (MYRBETRIQ) 50 MG TB24 TABLET    Take 1 tablet (50 mg total) by mouth daily.   THERAPEUTIC MULTIVITAMIN-MINERALS (THERAGRAN-M) TABLET    Take 1 tablet by mouth daily.   ZOLPIDEM (AMBIEN) 10 MG TABLET  Take 10 mg by mouth at bedtime as needed for sleep.  Modified Medications   Modified Medication Previous Medication   AMLODIPINE (NORVASC) 5 MG TABLET amLODipine (NORVASC) 5 MG tablet      Take 1 tablet (5 mg total) by mouth daily.    Take 5 mg by mouth daily.   ATORVASTATIN (LIPITOR) 10 MG TABLET atorvastatin (LIPITOR) 10 MG tablet      Take 1 tablet (10 mg total) by mouth daily.    Take 10 mg by mouth daily.   FAMOTIDINE (PEPCID) 40 MG TABLET famotidine (PEPCID) 40 MG tablet      Take 1 tablet (40 mg total) by mouth at bedtime.    Take 40 mg by mouth at bedtime.   SERTRALINE (ZOLOFT) 100 MG TABLET sertraline (ZOLOFT) 100 MG tablet      Take 1 tablet (100 mg total) by mouth daily.    Take 100 mg by mouth daily.   Discontinued Medications   No medications on file    Physical Exam:  Vitals:   02/15/22 0933  BP: 122/78  Pulse: 62  Temp: 98.2 F (36.8 C)  SpO2: 98%  Weight: 144 lb 6.4 oz (65.5 kg)  Height: 5\' 4"  (1.626 m)   Body mass index is 24.79 kg/m. Wt Readings from Last 3 Encounters:  02/15/22 144 lb 6.4 oz (65.5 kg)  01/25/22 148 lb (67.1 kg)  08/02/21 147 lb 12.8 oz (67 kg)    Physical Exam Constitutional:      Appearance: Normal appearance.  Cardiovascular:     Rate and Rhythm: Normal rate.  Pulmonary:     Effort: Pulmonary effort is normal.     Breath sounds: Normal breath sounds.  Musculoskeletal:     Comments: Straight leg raising test negative  Neurological:     General: No focal deficit present.     Mental Status: She is alert and oriented to person, place, and time.   Urine shows some trace leukocytes and blood  Labs reviewed: Basic Metabolic Panel: Recent Labs    06/13/21 1033  NA 141  K 4.4  CL 107  CO2 27  GLUCOSE 122  BUN 18  CREATININE 0.94  CALCIUM 9.8   Liver Function Tests: Recent Labs    06/13/21 1033  AST 22  ALT 15  BILITOT 0.6  PROT 6.6   No results for input(s): "LIPASE", "AMYLASE" in the last 8760 hours. No results for input(s): "AMMONIA" in the last 8760 hours. CBC: No results for input(s): "WBC", "NEUTROABS", "HGB", "HCT", "MCV", "PLT" in the last 8760 hours. Lipid Panel: Recent Labs    06/13/21 1033  CHOL 213*  HDL 93  LDLCALC 103*  TRIG 81  CHOLHDL 2.3   TSH: No results for input(s): "TSH" in the last 8760 hours. A1C: No results found for: "HGBA1C"   Assessment/Plan  1. Burning with urination We will do urine culture and begin Keflex pending result of culture.  If culture is negative consider use of medication like Uribel    4. Neuropathy As patient was leaving describes symptoms of numbness in her legs sounding much like neuropathy.  We will begin gabapentin and titrate begin 100 mg at bedtime and  titrating upward depending on response to medication     14/12/22, MD [facility] [phone]

## 2022-02-16 ENCOUNTER — Encounter: Payer: Self-pay | Admitting: Family Medicine

## 2022-02-16 LAB — BASIC METABOLIC PANEL WITH GFR
BUN/Creatinine Ratio: 14 (calc) (ref 6–22)
BUN: 17 mg/dL (ref 7–25)
CO2: 28 mmol/L (ref 20–32)
Calcium: 10.3 mg/dL (ref 8.6–10.4)
Chloride: 106 mmol/L (ref 98–110)
Creat: 1.2 mg/dL — ABNORMAL HIGH (ref 0.60–0.95)
Glucose, Bld: 98 mg/dL (ref 65–139)
Potassium: 4.6 mmol/L (ref 3.5–5.3)
Sodium: 142 mmol/L (ref 135–146)
eGFR: 46 mL/min/{1.73_m2} — ABNORMAL LOW (ref >=60)

## 2022-02-17 ENCOUNTER — Encounter: Payer: Self-pay | Admitting: Family Medicine

## 2022-02-17 LAB — URINE CULTURE
MICRO NUMBER:: 13788565
Result:: NO GROWTH
SPECIMEN QUALITY:: ADEQUATE

## 2022-02-20 ENCOUNTER — Other Ambulatory Visit: Payer: Self-pay | Admitting: Family Medicine

## 2022-02-21 MED ORDER — ZOLPIDEM TARTRATE 10 MG PO TABS: 10.0000 mg | ORAL_TABLET | Freq: Every evening | ORAL | 1 refills | Status: DC | PRN

## 2022-02-21 NOTE — Telephone Encounter (Signed)
Rx pended and sent to Dr. Hyacinth Meeker for approval.

## 2022-02-28 ENCOUNTER — Other Ambulatory Visit: Payer: Self-pay | Admitting: Family Medicine

## 2022-02-28 DIAGNOSIS — M545 Low back pain, unspecified: Secondary | ICD-10-CM

## 2022-02-28 NOTE — Progress Notes (Unsigned)
Renal u

## 2022-03-02 ENCOUNTER — Ambulatory Visit
Admission: RE | Admit: 2022-03-02 | Discharge: 2022-03-02 | Disposition: A | Discharge status: Home / Self Care | Payer: Medicare Other | Source: Ambulatory Visit | Attending: Family Medicine | Admitting: Family Medicine

## 2022-03-02 ENCOUNTER — Encounter: Payer: Self-pay | Admitting: Family Medicine

## 2022-03-02 ENCOUNTER — Other Ambulatory Visit: Payer: Self-pay | Admitting: *Deleted

## 2022-03-02 DIAGNOSIS — R109 Unspecified abdominal pain: Secondary | ICD-10-CM | POA: Diagnosis not present

## 2022-03-02 DIAGNOSIS — N3281 Overactive bladder: Secondary | ICD-10-CM

## 2022-03-02 DIAGNOSIS — R32 Unspecified urinary incontinence: Secondary | ICD-10-CM

## 2022-03-02 DIAGNOSIS — M545 Low back pain, unspecified: Secondary | ICD-10-CM

## 2022-03-02 MED ORDER — MIRABEGRON ER 50 MG PO TB24: 50.0000 mg | ORAL_TABLET | Freq: Every day | ORAL | 1 refills | Status: DC

## 2022-03-02 NOTE — Telephone Encounter (Signed)
Pharmacy requested refill

## 2022-03-06 ENCOUNTER — Other Ambulatory Visit: Payer: Self-pay | Admitting: Family Medicine

## 2022-03-06 DIAGNOSIS — R1084 Generalized abdominal pain: Secondary | ICD-10-CM

## 2022-03-08 ENCOUNTER — Other Ambulatory Visit: Payer: Self-pay | Admitting: Family Medicine

## 2022-03-08 DIAGNOSIS — R1084 Generalized abdominal pain: Secondary | ICD-10-CM

## 2022-03-30 ENCOUNTER — Ambulatory Visit
Admission: RE | Admit: 2022-03-30 | Discharge: 2022-03-30 | Disposition: A | Discharge status: Home / Self Care | Payer: Medicare Other | Source: Ambulatory Visit | Attending: Family Medicine | Admitting: Family Medicine

## 2022-03-30 DIAGNOSIS — K3189 Other diseases of stomach and duodenum: Secondary | ICD-10-CM | POA: Diagnosis not present

## 2022-03-30 DIAGNOSIS — K5730 Diverticulosis of large intestine without perforation or abscess without bleeding: Secondary | ICD-10-CM | POA: Diagnosis not present

## 2022-03-30 DIAGNOSIS — N200 Calculus of kidney: Secondary | ICD-10-CM | POA: Diagnosis not present

## 2022-03-30 DIAGNOSIS — R1084 Generalized abdominal pain: Secondary | ICD-10-CM

## 2022-03-30 DIAGNOSIS — D7389 Other diseases of spleen: Secondary | ICD-10-CM | POA: Diagnosis not present

## 2022-03-30 MED ORDER — IOPAMIDOL (ISOVUE-300) INJECTION 61%
100.0000 mL | Freq: Once | INTRAVENOUS | Status: AC | PRN
  Administered 2022-03-30: 100 mL via INTRAVENOUS

## 2022-04-03 ENCOUNTER — Encounter: Payer: Self-pay | Admitting: Family Medicine

## 2022-04-05 ENCOUNTER — Other Ambulatory Visit: Payer: Self-pay | Admitting: Family Medicine

## 2022-04-05 DIAGNOSIS — R1084 Generalized abdominal pain: Secondary | ICD-10-CM

## 2022-04-06 ENCOUNTER — Telehealth: Payer: Self-pay | Admitting: *Deleted

## 2022-04-06 NOTE — Telephone Encounter (Signed)
Bethany Schneider, daughter called regarding results from [place]. Left message on clinical intake stating that patient received a call with results and stated that Dr. Sabra Heck requested a 2nd opinion to a Urologist but referral was placed for General Surgeon.    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 We could ask for 2 nd opinions from the general surgeon,or urologist    Called and [facility] for Bethany Schneider to return call.

## 2022-04-06 NOTE — Telephone Encounter (Signed)
Spoke with Amy and she agreed with the 2nd opinion to [employer].

## 2022-04-15 ENCOUNTER — Encounter: Payer: Self-pay | Admitting: Family Medicine

## 2022-04-20 ENCOUNTER — Telehealth: Payer: Self-pay | Admitting: *Deleted

## 2022-04-20 NOTE — Telephone Encounter (Signed)
Bethany Schneider, daughter called and stated that Dr. Sabra Heck placed a referral to the General Surgeon for a second opinion and no one has called her with an appointment.   Referral was placed and sent: General 04/10/2022  1:38 PM Harle Battiest D Referral sent as patient requested they will contact patient with appointment. -  Note:   Referral sent as patient requested they will contact patient with appointment.   Release: [phone]   [facility]   Address: [address], [place], [place] [address]   Phone: [phone]   I gave daughter the phone number and she called and Destiny with the office told her that she could not schedule an appointment that it needed to come from the PCP office.   Sent Harle Battiest a message and she stated that she will look into it.

## 2022-04-24 NOTE — Telephone Encounter (Signed)
[  Thursday 10:23 AM] Bethany Schneider  Good Morning! I am calling in regards to patient Bethany Schneider [phone]. The referral for General Surgery. I gave daughter the number since it was submitted and the daughter called them but they would not schedule her. She spoke with a Destiny and she told her that the Dr. Gabriel Carina had to set up the appointment.    [Thursday 10:25 AM] Robina Ade, Helene Kelp  ok i will look into it now  [Thursday 10:26 AM] Devaughn Savant  Thank you!!     Another message sent to Harle Battiest for status of Referral 04/24/2022.

## 2022-04-26 ENCOUNTER — Telehealth: Payer: Self-pay

## 2022-04-26 NOTE — Telephone Encounter (Signed)
Patient's daughter called regarding referral to [facility]. Amy, daughter said that it has been over 3 weeks and she hasn't heard anything. She says that she Spoke with Rodena Piety May, CMA last week and told her that she spoke with [facility] and they told her that the provider office is suppose to schedule appointment. She says that she spoke with Rodena Piety and was told that Rodena Piety would get in contact with the referral team. She hasn't heard anything. She would like to know if there is somewhere else the referral can be sent to. She states that she is highly frustrated.  Can someone please look into this.  Message routed to [place], [place] and [place], [place]

## 2022-04-27 NOTE — Telephone Encounter (Signed)
I have [place] with Referrals last Thursday and again on Monday 10/23 requesting an update and no response.    [Thursday 10:23 AM] Elbert Polyakov  Good Morning! I am calling in regards to patient Bethany Schneider [phone]. The referral for General Surgery. I gave daughter the number since it was submitted and the daughter called them but they would not schedule her. She spoke with a Destiny and she told her that the Dr. Gabriel Carina had to set up the appointment.    [Thursday 10:25 AM] Robina Ade, Helene Kelp  ok i will look into it now    [Monday 10:54 AM] Jibril Mcminn  Good Morning! Have you heard anything with Bethany Schneider's Referral? MRN [id number]. Thank you

## 2022-04-27 NOTE — Telephone Encounter (Signed)
Another message sent today to Bethany Schneider for status update.

## 2022-05-02 NOTE — Telephone Encounter (Signed)
Message sent to the referral teal for Status update:  [8:40 AM] Ameir Faria Good Morning! I am calling in regards to patient Bethany Schneider [phone]. The referral for General Surgery. I gave daughter the number since it was submitted and the daughter called them but they would not schedule her. She spoke with a Destiny and she told her that the Dr. Gabriel Carina had to set up the appointment.    [8:41 AM] Mozelle Remlinger Referral was placed on 10/4. The daughter has tried calling them but they will not schedule her an appointment.   [8:41 AM] Hester Joslin Thanks for your help  [8:42 AM] Robina Ade, Helene Kelp good morning, I will call them and check the status of the referral.

## 2022-06-19 ENCOUNTER — Other Ambulatory Visit: Payer: Self-pay

## 2022-06-19 ENCOUNTER — Encounter: Payer: Self-pay | Admitting: Family Medicine

## 2022-06-19 NOTE — Telephone Encounter (Signed)
Patient send a MyChart message requesting a refill  on her Ambien 10 mg.

## 2022-06-20 MED ORDER — ZOLPIDEM TARTRATE 10 MG PO TABS: 10.0000 mg | ORAL_TABLET | Freq: Every evening | ORAL | 1 refills | Status: AC | PRN

## 2022-07-10 DIAGNOSIS — M48061 Spinal stenosis, lumbar region without neurogenic claudication: Secondary | ICD-10-CM | POA: Diagnosis not present

## 2022-07-10 DIAGNOSIS — M545 Low back pain, unspecified: Secondary | ICD-10-CM | POA: Diagnosis not present

## 2022-07-10 DIAGNOSIS — R9389 Abnormal findings on diagnostic imaging of other specified body structures: Secondary | ICD-10-CM | POA: Diagnosis not present

## 2022-07-10 DIAGNOSIS — R2681 Unsteadiness on feet: Secondary | ICD-10-CM | POA: Diagnosis not present

## 2022-07-13 DIAGNOSIS — E782 Mixed hyperlipidemia: Secondary | ICD-10-CM | POA: Diagnosis not present

## 2022-07-13 DIAGNOSIS — I10 Essential (primary) hypertension: Secondary | ICD-10-CM | POA: Diagnosis not present

## 2022-07-13 DIAGNOSIS — R9389 Abnormal findings on diagnostic imaging of other specified body structures: Secondary | ICD-10-CM | POA: Diagnosis not present

## 2022-07-13 DIAGNOSIS — K219 Gastro-esophageal reflux disease without esophagitis: Secondary | ICD-10-CM | POA: Diagnosis not present

## 2022-07-27 DIAGNOSIS — R269 Unspecified abnormalities of gait and mobility: Secondary | ICD-10-CM | POA: Diagnosis not present

## 2022-07-27 DIAGNOSIS — R251 Tremor, unspecified: Secondary | ICD-10-CM | POA: Diagnosis not present

## 2022-07-27 DIAGNOSIS — G629 Polyneuropathy, unspecified: Secondary | ICD-10-CM | POA: Diagnosis not present

## 2022-07-27 DIAGNOSIS — M4800 Spinal stenosis, site unspecified: Secondary | ICD-10-CM | POA: Diagnosis not present

## 2022-08-01 ENCOUNTER — Encounter: Payer: Self-pay | Admitting: Family Medicine

## 2022-08-09 ENCOUNTER — Other Ambulatory Visit: Payer: Self-pay | Admitting: Family Medicine

## 2022-08-09 DIAGNOSIS — M545 Low back pain, unspecified: Secondary | ICD-10-CM | POA: Diagnosis not present

## 2022-08-09 DIAGNOSIS — R2681 Unsteadiness on feet: Secondary | ICD-10-CM | POA: Diagnosis not present

## 2022-08-31 DIAGNOSIS — R2681 Unsteadiness on feet: Secondary | ICD-10-CM | POA: Diagnosis not present

## 2022-08-31 DIAGNOSIS — M545 Low back pain, unspecified: Secondary | ICD-10-CM | POA: Diagnosis not present

## 2022-09-04 DIAGNOSIS — R2681 Unsteadiness on feet: Secondary | ICD-10-CM | POA: Diagnosis not present

## 2022-09-04 DIAGNOSIS — G8929 Other chronic pain: Secondary | ICD-10-CM | POA: Diagnosis not present

## 2022-09-04 DIAGNOSIS — M545 Low back pain, unspecified: Secondary | ICD-10-CM | POA: Diagnosis not present

## 2022-09-05 DIAGNOSIS — R1012 Left upper quadrant pain: Secondary | ICD-10-CM | POA: Diagnosis not present

## 2022-09-05 DIAGNOSIS — R933 Abnormal findings on diagnostic imaging of other parts of digestive tract: Secondary | ICD-10-CM | POA: Diagnosis not present

## 2022-09-08 DIAGNOSIS — R2681 Unsteadiness on feet: Secondary | ICD-10-CM | POA: Diagnosis not present

## 2022-09-08 DIAGNOSIS — M545 Low back pain, unspecified: Secondary | ICD-10-CM | POA: Diagnosis not present

## 2022-09-08 DIAGNOSIS — G8929 Other chronic pain: Secondary | ICD-10-CM | POA: Diagnosis not present

## 2022-09-11 DIAGNOSIS — G8929 Other chronic pain: Secondary | ICD-10-CM | POA: Diagnosis not present

## 2022-09-11 DIAGNOSIS — R2681 Unsteadiness on feet: Secondary | ICD-10-CM | POA: Diagnosis not present

## 2022-09-11 DIAGNOSIS — M545 Low back pain, unspecified: Secondary | ICD-10-CM | POA: Diagnosis not present

## 2022-09-13 DIAGNOSIS — K3189 Other diseases of stomach and duodenum: Secondary | ICD-10-CM | POA: Diagnosis not present

## 2022-09-13 DIAGNOSIS — K222 Esophageal obstruction: Secondary | ICD-10-CM | POA: Diagnosis not present

## 2022-09-13 DIAGNOSIS — R1012 Left upper quadrant pain: Secondary | ICD-10-CM | POA: Diagnosis not present

## 2022-09-13 DIAGNOSIS — R933 Abnormal findings on diagnostic imaging of other parts of digestive tract: Secondary | ICD-10-CM | POA: Diagnosis not present

## 2022-09-13 DIAGNOSIS — K449 Diaphragmatic hernia without obstruction or gangrene: Secondary | ICD-10-CM | POA: Diagnosis not present

## 2022-11-09 ENCOUNTER — Other Ambulatory Visit: Payer: Self-pay | Admitting: Family Medicine

## 2022-11-17 ENCOUNTER — Other Ambulatory Visit: Payer: Self-pay | Admitting: Family Medicine

## 2022-11-17 DIAGNOSIS — N3281 Overactive bladder: Secondary | ICD-10-CM

## 2022-11-17 DIAGNOSIS — R32 Unspecified urinary incontinence: Secondary | ICD-10-CM

## 2022-12-13 DIAGNOSIS — M1611 Unilateral primary osteoarthritis, right hip: Secondary | ICD-10-CM | POA: Diagnosis not present

## 2022-12-13 DIAGNOSIS — M1612 Unilateral primary osteoarthritis, left hip: Secondary | ICD-10-CM | POA: Diagnosis not present

## 2022-12-13 DIAGNOSIS — I10 Essential (primary) hypertension: Secondary | ICD-10-CM | POA: Diagnosis not present

## 2022-12-13 DIAGNOSIS — M48061 Spinal stenosis, lumbar region without neurogenic claudication: Secondary | ICD-10-CM | POA: Diagnosis not present

## 2023-01-31 ENCOUNTER — Other Ambulatory Visit: Payer: Self-pay | Admitting: Family Medicine

## 2023-09-29 ENCOUNTER — Emergency Department (HOSPITAL_BASED_OUTPATIENT_CLINIC_OR_DEPARTMENT_OTHER)

## 2023-09-29 ENCOUNTER — Other Ambulatory Visit: Payer: Self-pay

## 2023-09-29 ENCOUNTER — Observation Stay (HOSPITAL_BASED_OUTPATIENT_CLINIC_OR_DEPARTMENT_OTHER)
Admission: EM | Admit: 2023-09-29 | Discharge: 2023-09-30 | Disposition: A | Discharge status: Home / Self Care | Attending: Internal Medicine | Admitting: Internal Medicine

## 2023-09-29 ENCOUNTER — Encounter (HOSPITAL_BASED_OUTPATIENT_CLINIC_OR_DEPARTMENT_OTHER): Payer: Self-pay

## 2023-09-29 ENCOUNTER — Observation Stay (HOSPITAL_COMMUNITY)

## 2023-09-29 DIAGNOSIS — Z96653 Presence of artificial knee joint, bilateral: Secondary | ICD-10-CM | POA: Insufficient documentation

## 2023-09-29 DIAGNOSIS — Z96641 Presence of right artificial hip joint: Secondary | ICD-10-CM | POA: Diagnosis not present

## 2023-09-29 DIAGNOSIS — R7989 Other specified abnormal findings of blood chemistry: Secondary | ICD-10-CM | POA: Diagnosis not present

## 2023-09-29 DIAGNOSIS — G479 Sleep disorder, unspecified: Secondary | ICD-10-CM | POA: Insufficient documentation

## 2023-09-29 DIAGNOSIS — R946 Abnormal results of thyroid function studies: Secondary | ICD-10-CM | POA: Insufficient documentation

## 2023-09-29 DIAGNOSIS — W19XXXA Unspecified fall, initial encounter: Secondary | ICD-10-CM | POA: Diagnosis not present

## 2023-09-29 DIAGNOSIS — Z1152 Encounter for screening for COVID-19: Secondary | ICD-10-CM | POA: Diagnosis not present

## 2023-09-29 DIAGNOSIS — Y92008 Other place in unspecified non-institutional (private) residence as the place of occurrence of the external cause: Secondary | ICD-10-CM | POA: Diagnosis not present

## 2023-09-29 DIAGNOSIS — E785 Hyperlipidemia, unspecified: Secondary | ICD-10-CM | POA: Diagnosis not present

## 2023-09-29 DIAGNOSIS — R55 Syncope and collapse: Principal | ICD-10-CM

## 2023-09-29 DIAGNOSIS — W010XXA Fall on same level from slipping, tripping and stumbling without subsequent striking against object, initial encounter: Secondary | ICD-10-CM | POA: Insufficient documentation

## 2023-09-29 DIAGNOSIS — M6282 Rhabdomyolysis: Secondary | ICD-10-CM | POA: Diagnosis not present

## 2023-09-29 DIAGNOSIS — I951 Orthostatic hypotension: Secondary | ICD-10-CM | POA: Diagnosis not present

## 2023-09-29 DIAGNOSIS — Y92009 Unspecified place in unspecified non-institutional (private) residence as the place of occurrence of the external cause: Secondary | ICD-10-CM

## 2023-09-29 DIAGNOSIS — I10 Essential (primary) hypertension: Secondary | ICD-10-CM | POA: Diagnosis not present

## 2023-09-29 DIAGNOSIS — E7800 Pure hypercholesterolemia, unspecified: Secondary | ICD-10-CM

## 2023-09-29 DIAGNOSIS — Z79899 Other long term (current) drug therapy: Secondary | ICD-10-CM | POA: Insufficient documentation

## 2023-09-29 DIAGNOSIS — R8281 Pyuria: Secondary | ICD-10-CM | POA: Insufficient documentation

## 2023-09-29 LAB — CBC WITH DIFFERENTIAL/PLATELET
Abs Immature Granulocytes: 0.04 10*3/uL (ref 0.00–0.07)
Basophils Absolute: 0 10*3/uL (ref 0.0–0.1)
Basophils Relative: 0 %
Eosinophils Absolute: 0.2 10*3/uL (ref 0.0–0.5)
Eosinophils Relative: 1 %
HCT: 37.6 % (ref 36.0–46.0)
Hemoglobin: 12.8 g/dL (ref 12.0–15.0)
Immature Granulocytes: 0 %
Lymphocytes Relative: 23 %
Lymphs Abs: 2.4 10*3/uL (ref 0.7–4.0)
MCH: 28.8 pg (ref 26.0–34.0)
MCHC: 34 g/dL (ref 30.0–36.0)
MCV: 84.7 fL (ref 80.0–100.0)
Monocytes Absolute: 0.9 10*3/uL (ref 0.1–1.0)
Monocytes Relative: 8 %
Neutro Abs: 7.1 10*3/uL (ref 1.7–7.7)
Neutrophils Relative %: 68 %
Platelets: 304 10*3/uL (ref 150–400)
RBC: 4.44 MIL/uL (ref 3.87–5.11)
RDW: 14.5 % (ref 11.5–15.5)
WBC: 10.6 10*3/uL — ABNORMAL HIGH (ref 4.0–10.5)
nRBC: 0 % (ref 0.0–0.2)

## 2023-09-29 LAB — URINALYSIS, MICROSCOPIC (REFLEX)

## 2023-09-29 LAB — URINALYSIS, ROUTINE W REFLEX MICROSCOPIC
Bilirubin Urine: NEGATIVE
Glucose, UA: NEGATIVE mg/dL
Ketones, ur: NEGATIVE mg/dL
Nitrite: NEGATIVE
Protein, ur: NEGATIVE mg/dL
Specific Gravity, Urine: 1.01 (ref 1.005–1.030)
pH: 6 (ref 5.0–8.0)

## 2023-09-29 LAB — CK
Total CK: 624 U/L — ABNORMAL HIGH (ref 38–234)
Total CK: 819 U/L — ABNORMAL HIGH (ref 38–234)

## 2023-09-29 LAB — CBC
HCT: 38.9 % (ref 36.0–46.0)
Hemoglobin: 13.1 g/dL (ref 12.0–15.0)
MCH: 28.4 pg (ref 26.0–34.0)
MCHC: 33.7 g/dL (ref 30.0–36.0)
MCV: 84.2 fL (ref 80.0–100.0)
Platelets: 290 10*3/uL (ref 150–400)
RBC: 4.62 MIL/uL (ref 3.87–5.11)
RDW: 14.6 % (ref 11.5–15.5)
WBC: 13.8 10*3/uL — ABNORMAL HIGH (ref 4.0–10.5)
nRBC: 0 % (ref 0.0–0.2)

## 2023-09-29 LAB — I-STAT CHEM 8, ED
BUN: 19 mg/dL (ref 8–23)
Calcium, Ion: 1.17 mmol/L (ref 1.15–1.40)
Chloride: 102 mmol/L (ref 98–111)
Creatinine, Ser: 0.9 mg/dL (ref 0.44–1.00)
Glucose, Bld: 133 mg/dL — ABNORMAL HIGH (ref 70–99)
HCT: 42 % (ref 36.0–46.0)
Hemoglobin: 14.3 g/dL (ref 12.0–15.0)
Potassium: 3.6 mmol/L (ref 3.5–5.1)
Sodium: 137 mmol/L (ref 135–145)
TCO2: 22 mmol/L (ref 22–32)

## 2023-09-29 LAB — TROPONIN I (HIGH SENSITIVITY)
Troponin I (High Sensitivity): 88 ng/L — ABNORMAL HIGH (ref <18)
Troponin I (High Sensitivity): 97 ng/L — ABNORMAL HIGH (ref <18)
Troponin I (High Sensitivity): 86 ng/L — ABNORMAL HIGH (ref <18)
Troponin I (High Sensitivity): 101 ng/L (ref <18)
Troponin I (High Sensitivity): 82 ng/L — ABNORMAL HIGH (ref <18)

## 2023-09-29 LAB — RAPID URINE DRUG SCREEN, HOSP PERFORMED
Amphetamines: NOT DETECTED
Barbiturates: NOT DETECTED
Benzodiazepines: NOT DETECTED
Cocaine: NOT DETECTED
Opiates: NOT DETECTED
Tetrahydrocannabinol: NOT DETECTED

## 2023-09-29 LAB — TSH: TSH: 5.665 u[IU]/mL — ABNORMAL HIGH (ref 0.350–4.500)

## 2023-09-29 LAB — LACTIC ACID, PLASMA
Lactic Acid, Venous: 1.5 mmol/L (ref 0.5–1.9)
Lactic Acid, Venous: 1.1 mmol/L (ref 0.5–1.9)

## 2023-09-29 LAB — COMPREHENSIVE METABOLIC PANEL WITH GFR
ALT: 21 U/L (ref 0–44)
AST: 44 U/L — ABNORMAL HIGH (ref 15–41)
Albumin: 3.5 g/dL (ref 3.5–5.0)
Alkaline Phosphatase: 64 U/L (ref 38–126)
Anion gap: 7 (ref 5–15)
BUN: 15 mg/dL (ref 8–23)
CO2: 23 mmol/L (ref 22–32)
Calcium: 9.5 mg/dL (ref 8.9–10.3)
Chloride: 107 mmol/L (ref 98–111)
Creatinine, Ser: 0.94 mg/dL (ref 0.44–1.00)
GFR, Estimated: >60 mL/min (ref >60)
Glucose, Bld: 121 mg/dL — ABNORMAL HIGH (ref 70–99)
Potassium: 3.6 mmol/L (ref 3.5–5.1)
Sodium: 137 mmol/L (ref 135–145)
Total Bilirubin: 0.8 mg/dL (ref 0.0–1.2)
Total Protein: 6.5 g/dL (ref 6.5–8.1)

## 2023-09-29 LAB — PREALBUMIN: Prealbumin: 21 mg/dL (ref 18–38)

## 2023-09-29 LAB — C-REACTIVE PROTEIN: CRP: 2 mg/dL — ABNORMAL HIGH (ref <1.0)

## 2023-09-29 LAB — MAGNESIUM: Magnesium: 1.9 mg/dL (ref 1.7–2.4)

## 2023-09-29 LAB — PHOSPHORUS: Phosphorus: 3 mg/dL (ref 2.5–4.6)

## 2023-09-29 LAB — D-DIMER, QUANTITATIVE: D-Dimer, Quant: 0.49 ug{FEU}/mL (ref 0.00–0.50)

## 2023-09-29 LAB — SEDIMENTATION RATE: Sed Rate: 9 mm/h (ref 0–22)

## 2023-09-29 MED ORDER — ONDANSETRON HCL 4 MG/2ML IJ SOLN: 4.0000 mg | Freq: Four times a day (QID) | INTRAMUSCULAR | Status: DC | PRN

## 2023-09-29 MED ORDER — MIRABEGRON ER 25 MG PO TB24
50.0000 mg | ORAL_TABLET | Freq: Every day | ORAL | Status: DC
  Administered 2023-09-29 – 2023-09-30 (×2): 50 mg via ORAL
  Filled 2023-09-29 (×2): qty 2

## 2023-09-29 MED ORDER — SODIUM CHLORIDE 0.9 % IV SOLN
1.0000 g | INTRAVENOUS | Status: DC
  Administered 2023-09-29: 1 g via INTRAVENOUS
  Filled 2023-09-29: qty 10

## 2023-09-29 MED ORDER — SODIUM CHLORIDE 0.9 % IV SOLN: INTRAVENOUS | Status: AC

## 2023-09-29 MED ORDER — SERTRALINE HCL 100 MG PO TABS
100.0000 mg | ORAL_TABLET | Freq: Every day | ORAL | Status: DC
  Administered 2023-09-29 – 2023-09-30 (×2): 100 mg via ORAL
  Filled 2023-09-29 (×2): qty 1

## 2023-09-29 MED ORDER — PANTOPRAZOLE SODIUM 40 MG PO TBEC
40.0000 mg | DELAYED_RELEASE_TABLET | Freq: Every day | ORAL | Status: DC
  Administered 2023-09-29 – 2023-09-30 (×2): 40 mg via ORAL
  Filled 2023-09-29 (×2): qty 1

## 2023-09-29 MED ORDER — ZOLPIDEM TARTRATE 5 MG PO TABS
5.0000 mg | ORAL_TABLET | Freq: Every evening | ORAL | Status: DC | PRN
  Administered 2023-09-29: 5 mg via ORAL
  Filled 2023-09-29: qty 1

## 2023-09-29 MED ORDER — ACETAMINOPHEN 650 MG RE SUPP: 650.0000 mg | Freq: Four times a day (QID) | RECTAL | Status: DC | PRN

## 2023-09-29 MED ORDER — ACETAMINOPHEN 325 MG PO TABS: 650.0000 mg | ORAL_TABLET | Freq: Four times a day (QID) | ORAL | Status: DC | PRN

## 2023-09-29 MED ORDER — HYDROCODONE-ACETAMINOPHEN 5-325 MG PO TABS: 1.0000 | ORAL_TABLET | ORAL | Status: DC | PRN

## 2023-09-29 MED ORDER — ONDANSETRON HCL 4 MG PO TABS: 4.0000 mg | ORAL_TABLET | Freq: Four times a day (QID) | ORAL | Status: DC | PRN

## 2023-09-29 MED ORDER — GABAPENTIN 300 MG PO CAPS
300.0000 mg | ORAL_CAPSULE | Freq: Every day | ORAL | Status: DC
  Administered 2023-09-30: 300 mg via ORAL
  Filled 2023-09-29: qty 1

## 2023-09-29 MED ORDER — SODIUM CHLORIDE 0.9 % IV BOLUS
1000.0000 mL | Freq: Once | INTRAVENOUS | Status: AC
  Administered 2023-09-29: 1000 mL via INTRAVENOUS

## 2023-09-29 NOTE — H&P (Addendum)
 [name] AOZ:[id number] DOB: 04-10-1941 DOA: 09/29/2023     PCP: Verlon Au, MD   Outpatient Specialists:   [facility]    Patient arrived to ER on 09/29/23 at 1010 Referred by Attending Therisa Doyne, MD   Patient coming from:    home Lives alone,      Chief Complaint:   Chief Complaint  Patient presents with   Fall    HPI: Bethany Schneider is a 83 y.o. female with medical history significant of hypertension hyperlipidemia sleep disturbance chronic back pain and unsteady gait, esophageal stricture    Presented with   fall at home  Patient comes from home had a fall unsure of the circumstances hit her head knees she has been lately, lightheaded when she moves.  Patient not on blood thinners she woke up on the floor and was unsure how long she has been down for. Per family and patient she often gets up in the middle of the night to use the bathroom this time she woke up on the floor but does not remember events leading up to her following but she is sure she hit her head because of a bruising and pain around left orbit Also endorsed neck pain back pain bilateral knee pain on the time of presentation with multiple bruises Otherwise no associated chest pain shortness of breath no nausea no vomiting abdominal pain she lives at home by herself    No hx of CAD Per family pt was up and the next thing she remembers she was on the ground (hard[place]) She was on the floor from 2 AM to 8 AM she was able to get to the cell phone and called At 2 AM to family unfortunately they did not hear the phone until  8 AM  She has incontinences at baseline and gets up 3-4 times at night  Denies dysuria no fever no chills   She is on chronic Ambien 5 mg po qhs  Denies significant ETOH intake  Does not smoke  No results found for: "SARSCOV2NAA"      Regarding pertinent Chronic problems:     Hyperlipidemia - on statins Lipitor (atorvastatin)  Lipid Panel     Component  Value Date/Time   CHOL 213 (H) 06/13/2021 1033   TRIG 81 06/13/2021 1033   HDL 93 06/13/2021 1033   CHOLHDL 2.3 06/13/2021 1033   LDLCALC 103 (H) 06/13/2021 1033     HTN on Norvasc    While in ER: Clinical Course as of 09/29/23 1925  Sat Sep 29, 2023  1229 CT Head Wo Contrast [CG]    Clinical Course User Index [CG] Delice Bison F, PA-C   Showed mild blood cell count elevation many bacteria in her urine troponins were initially slightly elevated 88 and then 97 also elevated total CK at 624  Imaging showed no evidence of fracture or intracranial bleeding  Lab Orders         CBC         CK         Urinalysis, Routine w reflex microscopic -Urine, Clean Catch         Urinalysis, Microscopic (reflex)         I-stat chem 8, ED (not at [facility], [facility] or [facility])     Right/Left knee -  1. No evidence of acute fracture or dislocation in the pelvis, bilateral hips or knees. 2. Postsurgical changes as described without evidence of hardware complication.  Following  Medications were ordered in ER: Medications  sodium chloride 0.9 % bolus 1,000 mL (0 mLs Intravenous Stopped 09/29/23 1409)       ED Triage Vitals  Encounter Vitals Group     BP 09/29/23 1022 137/65     Systolic BP Percentile --      Diastolic BP Percentile --      Pulse Rate 09/29/23 1022 85     Resp 09/29/23 1022 18     Temp 09/29/23 1022 98.6 F (37 C)     Temp Source 09/29/23 1022 Oral     SpO2 09/29/23 1022 99 %     Weight 09/29/23 1020 152 lb (68.9 kg)     Height 09/29/23 1020 5\' 4"  (1.626 m)     Head Circumference --      Peak Flow --      Pain Score 09/29/23 1019 1     Pain Loc --      Pain Education --      Exclude from Growth Chart --   VFIE(33)@     _________________________________________ Significant initial  Findings: Abnormal Labs Reviewed  CBC - Abnormal; Notable for the following components:      Result Value   WBC 13.8 (*)    All other components within normal limits  CK - Abnormal;  Notable for the following components:   Total CK 624 (*)    All other components within normal limits  URINALYSIS, ROUTINE W REFLEX MICROSCOPIC - Abnormal; Notable for the following components:   Hgb urine dipstick TRACE (*)    Leukocytes,Ua TRACE (*)    All other components within normal limits  URINALYSIS, MICROSCOPIC (REFLEX) - Abnormal; Notable for the following components:   Bacteria, UA MANY (*)    All other components within normal limits  I-STAT CHEM 8, ED - Abnormal; Notable for the following components:   Glucose, Bld 133 (*)    All other components within normal limits  TROPONIN I (HIGH SENSITIVITY) - Abnormal; Notable for the following components:   Troponin I (High Sensitivity) 88 (*)    All other components within normal limits  TROPONIN I (HIGH SENSITIVITY) - Abnormal; Notable for the following components:   Troponin I (High Sensitivity) 97 (*)    All other components within normal limits  TROPONIN I (HIGH SENSITIVITY) - Abnormal; Notable for the following components:   Troponin I (High Sensitivity) 86 (*)    All other components within normal limits      _________________________ Troponin  ordered Cardiac Panel (last 3 results) Recent Labs    09/29/23 1032 09/29/23 1247 09/29/23 1523  CKTOTAL 624*  --   --   TROPONINIHS 88* 97* 86*   ECG: Ordered Personally reviewed and interpreted by me showing: HR : 87 Sinus rhythm Ventricular premature complex Nonspecific intraventricular conduction delay Anterior infarct, old  QTC 485   The recent clinical data is shown below. Vitals:   09/29/23 1700 09/29/23 1706 09/29/23 1730 09/29/23 1831  BP: (!) 122/52  (!) 142/58 (!) 127/54  Pulse: 84  84 79  Resp: (!) 23  15 16   Temp:  98.1 F (36.7 C)  98.9 F (37.2 C)  TempSrc:  Oral  Oral  SpO2: 94%  97%   Weight:      Height:        WBC     Component Value Date/Time   WBC 13.8 (H) 09/29/2023 1032        Lactic Acid, Venous No results found for:  "  LATICACIDVEN"      UA slight amount of bacteria   Urine analysis:    Component Value Date/Time   COLORURINE YELLOW 09/29/2023 1209   APPEARANCEUR CLEAR 09/29/2023 1209   LABSPEC 1.010 09/29/2023 1209   PHURINE 6.0 09/29/2023 1209   GLUCOSEU NEGATIVE 09/29/2023 1209   HGBUR TRACE (A) 09/29/2023 1209   BILIRUBINUR NEGATIVE 09/29/2023 1209   BILIRUBINUR Negative 02/15/2022 0947   KETONESUR NEGATIVE 09/29/2023 1209   PROTEINUR NEGATIVE 09/29/2023 1209   UROBILINOGEN 0.2 02/15/2022 0947   NITRITE NEGATIVE 09/29/2023 1209   LEUKOCYTESUR TRACE (A) 09/29/2023 1209    Results for orders placed or performed in visit on 02/15/22  Culture, Urine     Status: None   Collection Time: 02/15/22 10:21 AM   Specimen: Urine  Result Value Ref Range Status   MICRO NUMBER: [id number]  Final   SPECIMEN QUALITY: Adequate  Final   Sample Source URINE  Final   STATUS: FINAL  Final   Result: No Growth  Final    ___________________________________________________ Recent Labs  Lab 09/29/23 1049  NA 137  K 3.6  GLUCOSE 133*  BUN 19  CREATININE 0.90    Cr    stable,    Lab Results  Component Value Date   CREATININE 0.90 09/29/2023   CREATININE 1.20 (H) 02/15/2022   CREATININE 0.94 06/13/2021    No results for input(s): "AST", "ALT", "ALKPHOS", "BILITOT", "PROT", "ALBUMIN" in the last 168 hours. Lab Results  Component Value Date   CALCIUM 10.3 02/15/2022    Plt: Lab Results  Component Value Date   PLT 290 09/29/2023       Recent Labs  Lab 09/29/23 1032 09/29/23 1049  WBC 13.8*  --   HGB 13.1 14.3  HCT 38.9 42.0  MCV 84.2  --   PLT 290  --     HG/HCT stable,      Component Value Date/Time   HGB 14.3 09/29/2023 1049   HCT 42.0 09/29/2023 1049   MCV 84.2 09/29/2023 1032    _______________________________________________ Hospitalist was called for admission for   Syncope,  Fall, initial encounter     The following Work up has been ordered so far:  Orders Placed This  Encounter  Procedures   CT Head Wo Contrast   CT Maxillofacial Wo Contrast   CT Cervical Spine Wo Contrast   DG Knee 2 Views Right   DG Knee 2 Views Left   DG Hip Unilat W or Wo Pelvis 2-3 Views Left   CT Thoracic Spine Wo Contrast   DG Hip Unilat W or Wo Pelvis 2-3 Views Right   CBC   CK   Urinalysis, Routine w reflex microscopic -Urine, Clean Catch   Urinalysis, Microscopic (reflex)   Diet Heart Room service appropriate? Yes; Fluid consistency: Thin   Orthostatic vital signs   ED Cardiac monitoring   Cardiac Monitoring Continuous x 48 hours Indications for use: Syncope of unknown etiology   Consult to hospitalist   I-stat chem 8, ED (not at [facility], [facility] or [facility])   ED EKG   EKG 12-Lead   EKG   EKG   Place in observation (patient's expected length of stay will be less than 2 midnights)     Cultures: No results found for: "SDES", "SPECREQUEST", "CULT", "REPTSTATUS"   Radiological Exams on Admission: CT Thoracic Spine Wo Contrast Result Date: 09/29/2023 CLINICAL DATA:  The fall. EXAM: CT THORACIC SPINE WITHOUT CONTRAST TECHNIQUE: Multidetector CT images of the thoracic were obtained  using the standard protocol without intravenous contrast. RADIATION DOSE REDUCTION: This exam was performed according to the departmental dose-optimization program which includes automated exposure control, adjustment of the mA and/or kV according to patient size and/or use of iterative reconstruction technique. COMPARISON:  CT chest dated September 04, 2005. FINDINGS: Alignment: Normal. Vertebrae: No acute fracture or focal pathologic process. Paraspinal and other soft tissues: Cardiomegaly. Coronary, aortic arch, and branch vessel atherosclerotic vascular disease. Small calculus in the lower pole of the left kidney. Disc levels: Mild degenerative disc disease in the midthoracic spine. No high-grade stenosis. IMPRESSION: 1. No acute osseous abnormality. Electronically Signed   By: Obie Dredge M.D.   On:  09/29/2023 12:18   CT Head Wo Contrast Result Date: 09/29/2023 CLINICAL DATA:  Fall.  Hit head.  Bruising around the right eye. EXAM: CT HEAD WITHOUT CONTRAST CT MAXILLOFACIAL WITHOUT CONTRAST CT CERVICAL SPINE WITHOUT CONTRAST TECHNIQUE: Multidetector CT imaging of the head, cervical spine, and maxillofacial structures were performed using the standard protocol without intravenous contrast. Multiplanar CT image reconstructions of the cervical spine and maxillofacial structures were also generated. RADIATION DOSE REDUCTION: This exam was performed according to the departmental dose-optimization program which includes automated exposure control, adjustment of the mA and/or kV according to patient size and/or use of iterative reconstruction technique. COMPARISON:  CT head dated April 13, 2008. FINDINGS: CT HEAD FINDINGS Brain: No evidence of acute infarction, hemorrhage, hydrocephalus, extra-axial collection or mass lesion/mass effect. Mild generalized cerebral atrophy. Scattered mild periventricular and subcortical white matter hypodensities are nonspecific, but favored to reflect chronic microvascular ischemic changes. Vascular: Atherosclerotic vascular calcification of the carotid siphons. No hyperdense vessel. Skull: Normal. Negative for fracture or focal lesion. Other: None. CT MAXILLOFACIAL FINDINGS Osseous: No fracture or mandibular dislocation. No destructive process. Orbits: Negative. No traumatic or inflammatory finding. Sinuses: Mild mucosal thickening in the left maxillary sinus. Remaining paranasal sinuses and mastoid air cells are clear. Soft tissues: Small right periorbital hematoma.  Otherwise negative. CT CERVICAL SPINE FINDINGS Alignment: Normal. Skull base and vertebrae: No acute fracture. No primary bone lesion or focal pathologic process. Soft tissues and spinal canal: No prevertebral fluid or swelling. No visible canal hematoma. Disc levels: Moderate to severe degenerative disc disease and  uncovertebral hypertrophy at C5-C6 and C6-C7. Scattered mild facet arthropathy throughout the cervical spine. Upper chest: Negative. Other: None. IMPRESSION: 1. No acute intracranial abnormality. 2. Small right periorbital hematoma. No facial bone fracture. 3. No acute cervical spine fracture or traumatic listhesis. Electronically Signed   By: Obie Dredge M.D.   On: 09/29/2023 12:12   CT Maxillofacial Wo Contrast Result Date: 09/29/2023 CLINICAL DATA:  Fall.  Hit head.  Bruising around the right eye. EXAM: CT HEAD WITHOUT CONTRAST CT MAXILLOFACIAL WITHOUT CONTRAST CT CERVICAL SPINE WITHOUT CONTRAST TECHNIQUE: Multidetector CT imaging of the head, cervical spine, and maxillofacial structures were performed using the standard protocol without intravenous contrast. Multiplanar CT image reconstructions of the cervical spine and maxillofacial structures were also generated. RADIATION DOSE REDUCTION: This exam was performed according to the departmental dose-optimization program which includes automated exposure control, adjustment of the mA and/or kV according to patient size and/or use of iterative reconstruction technique. COMPARISON:  CT head dated April 13, 2008. FINDINGS: CT HEAD FINDINGS Brain: No evidence of acute infarction, hemorrhage, hydrocephalus, extra-axial collection or mass lesion/mass effect. Mild generalized cerebral atrophy. Scattered mild periventricular and subcortical white matter hypodensities are nonspecific, but favored to reflect chronic microvascular ischemic changes. Vascular: Atherosclerotic vascular calcification of the  carotid siphons. No hyperdense vessel. Skull: Normal. Negative for fracture or focal lesion. Other: None. CT MAXILLOFACIAL FINDINGS Osseous: No fracture or mandibular dislocation. No destructive process. Orbits: Negative. No traumatic or inflammatory finding. Sinuses: Mild mucosal thickening in the left maxillary sinus. Remaining paranasal sinuses and mastoid air  cells are clear. Soft tissues: Small right periorbital hematoma.  Otherwise negative. CT CERVICAL SPINE FINDINGS Alignment: Normal. Skull base and vertebrae: No acute fracture. No primary bone lesion or focal pathologic process. Soft tissues and spinal canal: No prevertebral fluid or swelling. No visible canal hematoma. Disc levels: Moderate to severe degenerative disc disease and uncovertebral hypertrophy at C5-C6 and C6-C7. Scattered mild facet arthropathy throughout the cervical spine. Upper chest: Negative. Other: None. IMPRESSION: 1. No acute intracranial abnormality. 2. Small right periorbital hematoma. No facial bone fracture. 3. No acute cervical spine fracture or traumatic listhesis. Electronically Signed   By: Obie Dredge M.D.   On: 09/29/2023 12:12   CT Cervical Spine Wo Contrast Result Date: 09/29/2023 CLINICAL DATA:  Fall.  Hit head.  Bruising around the right eye. EXAM: CT HEAD WITHOUT CONTRAST CT MAXILLOFACIAL WITHOUT CONTRAST CT CERVICAL SPINE WITHOUT CONTRAST TECHNIQUE: Multidetector CT imaging of the head, cervical spine, and maxillofacial structures were performed using the standard protocol without intravenous contrast. Multiplanar CT image reconstructions of the cervical spine and maxillofacial structures were also generated. RADIATION DOSE REDUCTION: This exam was performed according to the departmental dose-optimization program which includes automated exposure control, adjustment of the mA and/or kV according to patient size and/or use of iterative reconstruction technique. COMPARISON:  CT head dated April 13, 2008. FINDINGS: CT HEAD FINDINGS Brain: No evidence of acute infarction, hemorrhage, hydrocephalus, extra-axial collection or mass lesion/mass effect. Mild generalized cerebral atrophy. Scattered mild periventricular and subcortical white matter hypodensities are nonspecific, but favored to reflect chronic microvascular ischemic changes. Vascular: Atherosclerotic vascular  calcification of the carotid siphons. No hyperdense vessel. Skull: Normal. Negative for fracture or focal lesion. Other: None. CT MAXILLOFACIAL FINDINGS Osseous: No fracture or mandibular dislocation. No destructive process. Orbits: Negative. No traumatic or inflammatory finding. Sinuses: Mild mucosal thickening in the left maxillary sinus. Remaining paranasal sinuses and mastoid air cells are clear. Soft tissues: Small right periorbital hematoma.  Otherwise negative. CT CERVICAL SPINE FINDINGS Alignment: Normal. Skull base and vertebrae: No acute fracture. No primary bone lesion or focal pathologic process. Soft tissues and spinal canal: No prevertebral fluid or swelling. No visible canal hematoma. Disc levels: Moderate to severe degenerative disc disease and uncovertebral hypertrophy at C5-C6 and C6-C7. Scattered mild facet arthropathy throughout the cervical spine. Upper chest: Negative. Other: None. IMPRESSION: 1. No acute intracranial abnormality. 2. Small right periorbital hematoma. No facial bone fracture. 3. No acute cervical spine fracture or traumatic listhesis. Electronically Signed   By: Obie Dredge M.D.   On: 09/29/2023 12:12   DG Knee 2 Views Right Result Date: 09/29/2023 CLINICAL DATA:  Status post fall. Bilateral hip and knee pain. Right periorbital bruising. EXAM: RIGHT KNEE - 1-2 VIEW; DG HIP (WITH OR WITHOUT PELVIS) 2-3V LEFT; DG HIP (WITH OR WITHOUT PELVIS) 2-3V RIGHT; LEFT KNEE - 1-2 VIEW COMPARISON:  Pelvic CT 03/30/2022. Pelvic and right hip radiographs 02/11/2017. 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 The hardware is intact without evidence of loosening. No evidence of acute fracture or dislocation. The sacroiliac joints are intact. Right knee: Status post total knee arthroplasty. The  hardware is intact without evidence of loosening. No evidence of acute fracture,  dislocation or significant knee joint effusion. The soft tissues appear unremarkable. Left knee: Status post total knee arthroplasty. The hardware appears intact without evidence of loosening. No evidence of acute fracture, dislocation or significant knee joint effusion. The soft tissues appear unremarkable. IMPRESSION: 1. No evidence of acute fracture or dislocation in the pelvis, bilateral hips or knees. 2. Postsurgical changes as described without evidence of hardware complication. Electronically Signed   By: Carey Bullocks M.D.   On: 09/29/2023 11:53   DG Knee 2 Views Left Result Date: 09/29/2023 CLINICAL DATA:  Status post fall. Bilateral hip and knee pain. Right periorbital bruising. EXAM: RIGHT KNEE - 1-2 VIEW; DG HIP (WITH OR WITHOUT PELVIS) 2-3V LEFT; DG HIP (WITH OR WITHOUT PELVIS) 2-3V RIGHT; LEFT KNEE - 1-2 VIEW COMPARISON:  Pelvic CT 03/30/2022. Pelvic and right hip radiographs 02/11/2017. 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 The hardware is intact without evidence of loosening. No evidence of acute fracture or dislocation. The sacroiliac joints are intact. Right knee: Status post total knee arthroplasty. The hardware is intact without evidence of loosening. No evidence of acute fracture, dislocation or significant knee joint effusion. The soft tissues appear unremarkable. Left knee: Status post total knee arthroplasty. The hardware appears intact without evidence of loosening. No evidence of acute fracture, dislocation or significant knee joint effusion. The soft tissues appear unremarkable. IMPRESSION: 1. No evidence of acute fracture or dislocation in the pelvis, bilateral hips or knees. 2. Postsurgical changes as described without evidence of hardware complication. Electronically Signed   By: Carey Bullocks M.D.   On: 09/29/2023 11:53   DG Hip Unilat W or Wo Pelvis 2-3  Views Left Result Date: 09/29/2023 CLINICAL DATA:  Status post fall. Bilateral hip and knee pain. Right periorbital bruising. EXAM: RIGHT KNEE - 1-2 VIEW; DG HIP (WITH OR WITHOUT PELVIS) 2-3V LEFT; DG HIP (WITH OR WITHOUT PELVIS) 2-3V RIGHT; LEFT KNEE - 1-2 VIEW COMPARISON:  Pelvic CT 03/30/2022. Pelvic and right hip radiographs 02/11/2017. 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 The hardware is intact without evidence of loosening. No evidence of acute fracture or dislocation. The sacroiliac joints are intact. Right knee: Status post total knee arthroplasty. The hardware is intact without evidence of loosening. No evidence of acute fracture, dislocation or significant knee joint effusion. The soft tissues appear unremarkable. Left knee: Status post total knee arthroplasty. The hardware appears intact without evidence of loosening. No evidence of acute fracture, dislocation or significant knee joint effusion. The soft tissues appear unremarkable. IMPRESSION: 1. No evidence of acute fracture or dislocation in the pelvis, bilateral hips or knees. 2. Postsurgical changes as described without evidence of hardware complication. Electronically Signed   By: Carey Bullocks M.D.   On: 09/29/2023 11:53   DG Hip Unilat W or Wo Pelvis 2-3 Views Right Result Date: 09/29/2023 CLINICAL DATA:  Status post fall. Bilateral hip and knee pain. Right periorbital bruising. EXAM: RIGHT KNEE - 1-2 VIEW; DG HIP (WITH OR WITHOUT PELVIS) 2-3V LEFT; DG HIP (WITH OR WITHOUT PELVIS) 2-3V RIGHT; LEFT KNEE - 1-2 VIEW COMPARISON:  Pelvic CT 03/30/2022. Pelvic and right hip radiographs 02/11/2017. FINDINGS: AP pelvis and bilateral hips: Status post right total hip arthroplasty with a screw fixed  acetabular component. Status post proximal left femoral ORIF. The hardware is intact without evidence of loosening. No evidence of  acute fracture or dislocation. The sacroiliac joints are intact. Right knee: Status post total knee arthroplasty. The hardware is intact without evidence of loosening. No evidence of acute fracture, dislocation or significant knee joint effusion. The soft tissues appear unremarkable. Left knee: Status post total knee arthroplasty. The hardware appears intact without evidence of loosening. No evidence of acute fracture, dislocation or significant knee joint effusion. The soft tissues appear unremarkable. IMPRESSION: 1. No evidence of acute fracture or dislocation in the pelvis, bilateral hips or knees. 2. Postsurgical changes as described without evidence of hardware complication. Electronically Signed   By: Carey Bullocks M.D.   On: 09/29/2023 11:53   _______________________________________________________________________________________________________ Latest  Blood pressure (!) 127/54, pulse 79, temperature 98.9 F (37.2 C), temperature source Oral, resp. rate 16, height 5\' 4"  (1.626 m), weight 68.9 kg, SpO2 97%.   Vitals  labs and radiology finding personally reviewed  Review of Systems:    Pertinent positives include:   fatigue  Constitutional:  No weight loss, night sweats, Fevers, chills,, weight loss  HEENT:  No headaches, Difficulty swallowing,Tooth/dental problems,Sore throat,  No sneezing, itching, ear ache, nasal congestion, post nasal drip,  Cardio-vascular:  No chest pain, Orthopnea, PND, anasarca, dizziness, palpitations.no Bilateral lower extremity swelling  GI:  No heartburn, indigestion, abdominal pain, nausea, vomiting, diarrhea, change in bowel habits, loss of appetite, melena, blood in stool, hematemesis Resp:  no shortness of breath at rest. No dyspnea on exertion, No excess mucus, no productive cough, No non-productive cough, No coughing up of blood.No change in color of mucus.No wheezing. Skin:  no rash or lesions. No jaundice GU:  no dysuria, change in color of  urine, no urgency or frequency. No straining to urinate.  No flank pain.  Musculoskeletal:  No joint pain or no joint swelling. No decreased range of motion. No back pain.  Psych:  No change in mood or affect. No depression or anxiety. No memory loss.  Neuro: no localizing neurological complaints, no tingling, no weakness, no double vision, no gait abnormality, no slurred speech, no confusion  All systems reviewed and apart from HOPI all are negative _______________________________________________________________________________________________ Past Medical History:   Past Medical History:  Diagnosis Date   Arthritis    DJD (degenerative joint disease) right hip   History of MRI    Hyperlipidemia    Hypertension    Lumbar radiculopathy 09/13/2014   Peptic ulceration    Sleep disturbance 06/03/2015   Trochanteric bursitis of right hip 09/13/2014     Past Surgical History:  Procedure Laterality Date   ANKLE SURGERY  1985   cartilage replaced   BREAST BIOPSY  1965   BREAST REDUCTION SURGERY Bilateral 1990   CATARACT EXTRACTION W/ INTRAOCULAR LENS  IMPLANT, BILATERAL  2004   CHOLECYSTECTOMY  2004   COLONOSCOPY  07/14/2005   KIDNEY SURGERY  1962   NASAL TURBINATE REDUCTION  1987 and 2015   TOTAL ABDOMINAL HYSTERECTOMY  1987   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 Louis Matte   TOTAL KNEE ARTHROPLASTY Bilateral 2012 & 2014   TUBAL LIGATION  1975   WISDOM TOOTH EXTRACTION  1970    Social History:  Ambulatory   independently  has a  walker at home   reports that she has never smoked. She has never used smokeless tobacco. She reports that she does not drink alcohol and does not use  drugs.   Family History:  Family History  Problem Relation Age of Onset   Hypertension Mother    Heart disease Father    Hypertension Father    Colon cancer Sister    Cancer Brother    Healthy Daughter    Healthy Daughter    Healthy Son    ___________________________________________________________________________________ Allergies: Allergies  Allergen Reactions   Aspirin     Gastric ulcer   Ciprofloxacin     Sores in throat   Ibuprofen Other (See Comments)    Irritates stomach    Percocet [Oxycodone-Acetaminophen] Nausea Only   Tramadol     Stomach burning   Penicillins Rash    Prior to Admission medications   Medication Sig Start Date End Date Taking? Authorizing Provider  amLODipine (NORVASC) 5 MG tablet Take 1 tablet (5 mg total) by mouth daily. 02/15/22   Frederica Kuster, MD  atorvastatin (LIPITOR) 10 MG tablet Take 1 tablet (10 mg total) by mouth daily. 02/15/22   Frederica Kuster, MD  Calcium-Magnesium-Zinc [phone] MG TABS Take by mouth. Take one tablet 3 times a day    [provider]  cephALEXin (KEFLEX) 250 MG capsule Take 1 capsule (250 mg total) by mouth 2 (two) times daily. 02/15/22   Frederica Kuster, MD  famotidine (PEPCID) 40 MG tablet Take 1 tablet (40 mg total) by mouth at bedtime. 02/15/22   Frederica Kuster, MD  gabapentin (NEURONTIN) 100 MG capsule Take 1 capsule (100 mg total) by mouth 3 (three) times daily. 02/15/22   Frederica Kuster, MD  mirabegron ER (MYRBETRIQ) 50 MG TB24 tablet Take 1 tablet (50 mg total) by mouth daily. 03/02/22   Frederica Kuster, MD  sertraline (ZOLOFT) 100 MG tablet Take 1 tablet by mouth once daily 08/09/22   Frederica Kuster, MD  therapeutic multivitamin-minerals [facility]) tablet Take 1 tablet by mouth daily.    [provider]  zolpidem (AMBIEN) 10 MG tablet Take 1 tablet (10 mg total) by mouth at bedtime as needed for sleep. 06/20/22   Frederica Kuster, MD    ___________________________________________________________________________________________________ Physical Exam:    09/29/2023    6:31 PM 09/29/2023    5:30 PM 09/29/2023    5:00 PM  Vitals with BMI  Systolic 127 142 323  Diastolic 54 58 52  Pulse 79 84 84     1. General:  in  No Acute distress well -appearing multiple bruises 2. Psychological: Alert and   Oriented 3. Head/ENT:    Dry Mucous Membranes                          Head Non traumatic, neck supple                           Poor Dentition 4. SKIN:  decreased Skin turgor,  Skin clean Dry and intact no rash    5. Heart: Regular rate and rhythm no  Murmur, no Rub or gallop 6. Lungs: , no wheezes or crackles   7. Abdomen: Soft,  non-tender, Non distended bowel sounds present 8. Lower extremities: no clubbing, cyanosis, no  edema 9. Neurologically  strength 5 out of 5 in all 4 extremities cranial nerves II through XII intact 10. MSK: Normal range of motion    Chart has been reviewed  ______________________________________________________________________________________________  Assessment/Plan  83 y.o. female with medical history significant of hypertension hyperlipidemia sleep disturbance chronic  back pain and unsteady gait, esophageal stricture  Admitted for  Syncope, Fall, initial encounter  Present on Admission:  Syncope  Hyperlipidemia  Hypertension  Sleep disturbance  Syncope and collapse  Elevated troponin  Rhabdomyolysis  Elevated TSH  Pyuria   Hyperlipidemia Hold Lipitor given CK elevation  Hypertension Allow permissive hypertension  Sleep disturbance Patient is on longstanding Ambien 5 mg at nighttime.  Discussed with family that they need to follow-up with primary care provider and see if there is any other options are somewhat less sedating. Patient has been taking this for decades  Fall at home, initial encounter Unclear if mechanical fall versus syncope patient cannot recollect This occurred at night PT OT evaluation prior to discharge No evidence of fractures noted  Syncope and collapse Unclear if true syncope versus mechanical fall patient does not recollect what happened. She clearly did have a head trauma CT head unremarkable Otherwise patient appears to be  neurologically intact Troponin noted to be somewhat elevated but stable in the setting of elevated CK Rehydrate Echogram in the morning serial ECG If abnormal echo will need further cardiac workup and evaluation  Elevated troponin In the setting of elevated CK and dehydration.  No chest pain. Continue to monitor on telemetry.  Repeat troponin.  Echo in the morning.  Given persistent elevated troponin may benefit from cardiology eval in the morning currently chest pain-free Completion obtain D-dimer and if positive obtain CTA given unclear etiology of syncope  Rhabdomyolysis Hold Lipitor rehydrate follow serial CK Patient does endorse recent viral illness will check for COVID for completion  Elevated TSH Possible undiagnosed hypothyroidism.  Check T T3 and T4 if low may need to start on Synthroid  Pyuria Patient with fall possible syncope increase urination UA have bacteria as well as leukocytes.  Urine culture pending for tonight cover Rocephin  Other plan as per orders.  DVT prophylaxis:  SCD    Code Status: DNR/DNI as per patient   I had personally discussed CODE STATUS with patient and family ACP  none  Family Communication:   Family not at  Bedside  plan of care was discussed on the phone with Daughter  Diet  Diet Orders (From admission, onward)     Start     Ordered   09/29/23 1933  Diet Heart Room service appropriate? Yes; Fluid consistency: Thin  Diet effective now       Question Answer Comment  Room service appropriate? Yes   Fluid consistency: Thin      09/29/23 1932            Disposition Plan:   To home once workup is complete and patient is stable   Following barriers for discharge:                           syncope work up is complete                                   Consult Orders  (From admission, onward)           Start     Ordered   09/29/23 1323  Consult to hospitalist  Paged Hospitalist for ED consult via [facility] 02:12p   Once       Provider:  (Not yet assigned)  Question Answer Comment  Place call to: [facility]   Reason for Consult  Admit      09/29/23 1348                               Would benefit from PT/OT eval prior to DC  Ordered                    Consults called: none   May benefit from a.m. consult from cardiology   Admission status:  ED Disposition     ED Disposition  Admit   Condition  --   Comment  Hospital Area: [facility] [[id number]]  Level of Care: Telemetry Medical [104]  Interfacility transfer: Yes  May place patient in observation at [facility] or [facility] if equivalent level of care is available:: No  Covid Evaluation: Asymptomatic - no recent exposure (last 10 days) testing not required  Diagnosis: Syncope [206001]  Admitting Physician: Coralie Keens [[id number]]  Attending Physician: Coralie Keens [[id number]]           Obs     Level of care     tele  For 12H     Latondra Gebhart 09/29/2023, 10:00 PM    [facility]     after 2 AM please page floor coverage PA If 7AM-7PM, please contact the day team taking care of the patient using [email or web address]

## 2023-09-29 NOTE — ED Notes (Signed)
 Pt would like food. Verified with provider that it's ok for pt to eat.

## 2023-09-29 NOTE — Assessment & Plan Note (Signed)
 Allow permissive hypertension

## 2023-09-29 NOTE — Assessment & Plan Note (Signed)
 Patient with fall possible syncope increase urination UA have bacteria as well as leukocytes.  Urine culture pending for tonight cover Rocephin

## 2023-09-29 NOTE — ED Triage Notes (Signed)
 C/o fall at 0200, unsure of reason why she fell. Hit head, bruising around right eye. Left hip and bilateral knee pain. States has been becoming lightheaded with movement. Denies blood thinners, unsure of LOC, states woke up on the floor, unsure how long she was there

## 2023-09-29 NOTE — ED Notes (Signed)
 Patient transported to CT

## 2023-09-29 NOTE — Assessment & Plan Note (Signed)
 Possible undiagnosed hypothyroidism.  Check T T3 and T4 if low may need to start on Synthroid

## 2023-09-29 NOTE — Assessment & Plan Note (Signed)
 Patient is on longstanding Ambien 5 mg at nighttime.  Discussed with family that they need to follow-up with primary care provider and see if there is any other options are somewhat less sedating. Patient has been taking this for decades

## 2023-09-29 NOTE — ED Notes (Signed)
 Fall risk armband Fall risk sign on door Patient wearing shoes

## 2023-09-29 NOTE — Assessment & Plan Note (Signed)
 Hold Lipitor given CK elevation

## 2023-09-29 NOTE — Progress Notes (Signed)
 Arrived via [facility] to 3W02; patient is alert; oriented to room and unit routine.

## 2023-09-29 NOTE — ED Notes (Signed)
 Macaroni and cheese for patient and family member Hot chocolate Peanut butter crackers Water Recliner in room for family members comfort

## 2023-09-29 NOTE — Assessment & Plan Note (Signed)
 Unclear if mechanical fall versus syncope patient cannot recollect This occurred at night PT OT evaluation prior to discharge No evidence of fractures noted

## 2023-09-29 NOTE — Assessment & Plan Note (Addendum)
 In the setting of elevated CK and dehydration.  No chest pain. Continue to monitor on telemetry.  Repeat troponin.  Echo in the morning.  Given persistent elevated troponin may benefit from cardiology eval in the morning currently chest pain-free Completion obtain D-dimer and if positive obtain CTA given unclear etiology of syncope

## 2023-09-29 NOTE — Subjective & Objective (Signed)
 Patient comes from home had a fall unsure of the circumstances hit her head knees she has been lately, lightheaded when she moves.  Patient not on blood thinners she woke up on the floor and was unsure how long she has been down for. Per family and patient she often gets up in the middle of the night to use the bathroom this time she woke up on the floor but does not remember events leading up to her following but she is sure she hit her head because of a bruising and pain around left orbit Also endorsed neck pain back pain bilateral knee pain on the time of presentation with multiple bruises Otherwise no associated chest pain shortness of breath no nausea no vomiting abdominal pain she lives at home by herself

## 2023-09-29 NOTE — ED Provider Notes (Cosign Needed Addendum)
 [place] [facility] Provider Note   CSN: [id number] Arrival date & time: 09/29/23  1010     History  Chief Complaint  Patient presents with   Bethany Schneider is a 83 y.o. female with medical history of sleep disturbance, lumbar radiculopathy, hypertension, hyperlipidemia.  Patient presents to ED with her daughter for evaluation of fall.  The patient reports that she often gets up in the middle of the night to go to the bathroom, sometimes as often as 3 times a night.  She reports that last night she got up she believes to use the bathroom however she is not entirely sure.  She reports that she must of fallen when she got up because the next thing she remembers is waking up on the floor.  She does not remember falling, does not remember the events leading up to her being on the floor.  She denies blood thinning medications.  She states that she did strike her head, she struck her left orbit.  She is complaining of neck pain, thoracic back pain, bilateral knee pain, bilateral hip pain.  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  She states that she was on the ground for an unknown amount of time because she was not able to help her self to her feet.  On exam, right denies any chest pain or shortness of breath.  Denies any nausea, vomiting, abdominal pain.  Denies lightheadedness, dizziness or weakness.  Denies medications prior to arrival.  Denies history of recurrent falls or falling more frequently recently.  Lives at home alone.   Fall Pertinent negatives include no chest pain, no abdominal pain, no headaches and no shortness of breath.       Home Medications Prior to Admission medications   Medication Sig Start Date End Date Taking? Authorizing Provider  amLODipine (NORVASC) 5 MG tablet Take 1 tablet (5 mg total) by mouth daily. 02/15/22   Frederica Kuster, MD  atorvastatin (LIPITOR) 10 MG tablet Take 1 tablet  (10 mg total) by mouth daily. 02/15/22   Frederica Kuster, MD  Calcium-Magnesium-Zinc [phone] MG TABS Take by mouth. Take one tablet 3 times a day    [provider]  cephALEXin (KEFLEX) 250 MG capsule Take 1 capsule (250 mg total) by mouth 2 (two) times daily. 02/15/22   Frederica Kuster, MD  famotidine (PEPCID) 40 MG tablet Take 1 tablet (40 mg total) by mouth at bedtime. 02/15/22   Frederica Kuster, MD  gabapentin (NEURONTIN) 100 MG capsule Take 1 capsule (100 mg total) by mouth 3 (three) times daily. 02/15/22   Frederica Kuster, MD  mirabegron ER (MYRBETRIQ) 50 MG TB24 tablet Take 1 tablet (50 mg total) by mouth daily. 03/02/22   Frederica Kuster, MD  sertraline (ZOLOFT) 100 MG tablet Take 1 tablet by mouth once daily 08/09/22   Frederica Kuster, MD  therapeutic multivitamin-minerals [facility]) tablet Take 1 tablet by mouth daily.    [provider]  zolpidem (AMBIEN) 10 MG tablet Take 1 tablet (10 mg total) by mouth at bedtime as needed for sleep. 06/20/22   Frederica Kuster, MD      Allergies    Aspirin, Ciprofloxacin, Ibuprofen, Percocet [oxycodone-acetaminophen], Tramadol, and Penicillins    Review of Systems   Review of Systems  Constitutional:  Negative for fever.  Respiratory:  Negative for shortness of breath.   Cardiovascular:  Negative for  chest pain.  Gastrointestinal:  Negative for abdominal pain, nausea and vomiting.  Genitourinary:  Negative for dysuria.  Musculoskeletal:  Positive for back pain and neck pain.  Neurological:  Positive for syncope. Negative for dizziness, weakness, light-headedness, numbness and headaches.  All other systems reviewed and are negative.   Physical Exam Updated Vital Signs BP (!) 112/95   Pulse 82   Temp 98.6 F (37 C)   Resp (!) 28   Ht 5\' 4"  (1.626 m)   Wt 68.9 kg   LMP  (LMP Unknown)   SpO2 96%   BMI 26.09 kg/m  Physical Exam Vitals and nursing note reviewed.  Constitutional:      General: She is not  in acute distress.    Appearance: She is well-developed.  HENT:     Head: Normocephalic and atraumatic.  Eyes:     Conjunctiva/sclera: Conjunctivae normal.  Neck:     Comments: Paracervical spinal tenderness bilaterally.  No centralized cervical spinal tenderness.  Appropriate range of motion of cervical spine appreciated. Cardiovascular:     Rate and Rhythm: Normal rate and regular rhythm.     Heart sounds: No murmur heard. Pulmonary:     Effort: Pulmonary effort is normal. No respiratory distress.     Breath sounds: Normal breath sounds.  Abdominal:     Palpations: Abdomen is soft.     Tenderness: There is no abdominal tenderness.  Musculoskeletal:        General: No swelling.     Cervical back: Neck supple. Tenderness present.     Thoracic back: Tenderness present.       Back:     Comments: Tenderness to palpation of the midline thoracic back.  No step-off or crepitus noted.  Skin:    General: Skin is warm and dry.     Capillary Refill: Capillary refill takes less than 2 seconds.  Neurological:     General: No focal deficit present.     Mental Status: She is alert and oriented to person, place, and time.     GCS: GCS eye subscore is 4. GCS verbal subscore is 5. GCS motor subscore is 6.     Cranial Nerves: Cranial nerves 2-12 are intact. No cranial nerve deficit.     Sensory: Sensation is intact. No sensory deficit.     Motor: Motor function is intact. No weakness.     Coordination: Coordination is intact. Heel to [facility] Test normal.     Comments: Neurological examinations at baseline without focal neurodeficits.  CN II through XII intact.  Intact finger-nose and heel-to-shin.  5 out of 5 strength bilateral lower extremities.  5 out of 5 strength bilateral upper extremities.  Psychiatric:        Mood and Affect: Mood normal.     ED Results / Procedures / Treatments   Labs (all labs ordered are listed, but only abnormal results are displayed) Labs Reviewed  CBC - Abnormal;  Notable for the following components:      Result Value   WBC 13.8 (*)    All other components within normal limits  CK - Abnormal; Notable for the following components:   Total CK 624 (*)    All other components within normal limits  URINALYSIS, ROUTINE W REFLEX MICROSCOPIC - Abnormal; Notable for the following components:   Hgb urine dipstick TRACE (*)    Leukocytes,Ua TRACE (*)    All other components within normal limits  URINALYSIS, MICROSCOPIC (REFLEX) - Abnormal; Notable for the following  components:   Bacteria, UA MANY (*)    All other components within normal limits  I-STAT CHEM 8, ED - Abnormal; Notable for the following components:   Glucose, Bld 133 (*)    All other components within normal limits  TROPONIN I (HIGH SENSITIVITY) - Abnormal; Notable for the following components:   Troponin I (High Sensitivity) 88 (*)    All other components within normal limits  TROPONIN I (HIGH SENSITIVITY) - Abnormal; Notable for the following components:   Troponin I (High Sensitivity) 97 (*)    All other components within normal limits  TROPONIN I (HIGH SENSITIVITY)    EKG EKG Interpretation Date/Time:  Saturday September 29 2023 10:31:29 EDT Ventricular Rate:  87 PR Interval:  172 QRS Duration:  122 QT Interval:  403 QTC Calculation: 485 R Axis:   -4  Text Interpretation: Sinus rhythm Ventricular premature complex Nonspecific intraventricular conduction delay Anterior infarct, old Confirmed by Estanislado Pandy [phone]) on 09/29/2023 1:23:04 PM  Radiology CT Thoracic Spine Wo Contrast Result Date: 09/29/2023 CLINICAL DATA:  The fall. EXAM: CT THORACIC SPINE WITHOUT CONTRAST TECHNIQUE: Multidetector CT images of the thoracic were obtained using the standard protocol without intravenous contrast. RADIATION DOSE REDUCTION: This exam was performed according to the departmental dose-optimization program which includes automated exposure control, adjustment of the mA and/or kV according to  patient size and/or use of iterative reconstruction technique. COMPARISON:  CT chest dated September 04, 2005. FINDINGS: Alignment: Normal. Vertebrae: No acute fracture or focal pathologic process. Paraspinal and other soft tissues: Cardiomegaly. Coronary, aortic arch, and branch vessel atherosclerotic vascular disease. Small calculus in the lower pole of the left kidney. Disc levels: Mild degenerative disc disease in the midthoracic spine. No high-grade stenosis. IMPRESSION: 1. No acute osseous abnormality. Electronically Signed   By: Obie Dredge M.D.   On: 09/29/2023 12:18   CT Head Wo Contrast Result Date: 09/29/2023 CLINICAL DATA:  Fall.  Hit head.  Bruising around the right eye. EXAM: CT HEAD WITHOUT CONTRAST CT MAXILLOFACIAL WITHOUT CONTRAST CT CERVICAL SPINE WITHOUT CONTRAST TECHNIQUE: Multidetector CT imaging of the head, cervical spine, and maxillofacial structures were performed using the standard protocol without intravenous contrast. Multiplanar CT image reconstructions of the cervical spine and maxillofacial structures were also generated. RADIATION DOSE REDUCTION: This exam was performed according to the departmental dose-optimization program which includes automated exposure control, adjustment of the mA and/or kV according to patient size and/or use of iterative reconstruction technique. COMPARISON:  CT head dated April 13, 2008. FINDINGS: CT HEAD FINDINGS Brain: No evidence of acute infarction, hemorrhage, hydrocephalus, extra-axial collection or mass lesion/mass effect. Mild generalized cerebral atrophy. Scattered mild periventricular and subcortical white matter hypodensities are nonspecific, but favored to reflect chronic microvascular ischemic changes. Vascular: Atherosclerotic vascular calcification of the carotid siphons. No hyperdense vessel. Skull: Normal. Negative for fracture or focal lesion. Other: None. CT MAXILLOFACIAL FINDINGS Osseous: No fracture or mandibular dislocation. No  destructive process. Orbits: Negative. No traumatic or inflammatory finding. Sinuses: Mild mucosal thickening in the left maxillary sinus. Remaining paranasal sinuses and mastoid air cells are clear. Soft tissues: Small right periorbital hematoma.  Otherwise negative. CT CERVICAL SPINE FINDINGS Alignment: Normal. Skull base and vertebrae: No acute fracture. No primary bone lesion or focal pathologic process. Soft tissues and spinal canal: No prevertebral fluid or swelling. No visible canal hematoma. Disc levels: Moderate to severe degenerative disc disease and uncovertebral hypertrophy at C5-C6 and C6-C7. Scattered mild facet arthropathy throughout the cervical spine. Upper chest: Negative. Other:  None. IMPRESSION: 1. No acute intracranial abnormality. 2. Small right periorbital hematoma. No facial bone fracture. 3. No acute cervical spine fracture or traumatic listhesis. Electronically Signed   By: Obie Dredge M.D.   On: 09/29/2023 12:12   CT Maxillofacial Wo Contrast Result Date: 09/29/2023 CLINICAL DATA:  Fall.  Hit head.  Bruising around the right eye. EXAM: CT HEAD WITHOUT CONTRAST CT MAXILLOFACIAL WITHOUT CONTRAST CT CERVICAL SPINE WITHOUT CONTRAST TECHNIQUE: Multidetector CT imaging of the head, cervical spine, and maxillofacial structures were performed using the standard protocol without intravenous contrast. Multiplanar CT image reconstructions of the cervical spine and maxillofacial structures were also generated. RADIATION DOSE REDUCTION: This exam was performed according to the departmental dose-optimization program which includes automated exposure control, adjustment of the mA and/or kV according to patient size and/or use of iterative reconstruction technique. COMPARISON:  CT head dated April 13, 2008. FINDINGS: CT HEAD FINDINGS Brain: No evidence of acute infarction, hemorrhage, hydrocephalus, extra-axial collection or mass lesion/mass effect. Mild generalized cerebral atrophy. Scattered  mild periventricular and subcortical white matter hypodensities are nonspecific, but favored to reflect chronic microvascular ischemic changes. Vascular: Atherosclerotic vascular calcification of the carotid siphons. No hyperdense vessel. Skull: Normal. Negative for fracture or focal lesion. Other: None. CT MAXILLOFACIAL FINDINGS Osseous: No fracture or mandibular dislocation. No destructive process. Orbits: Negative. No traumatic or inflammatory finding. Sinuses: Mild mucosal thickening in the left maxillary sinus. Remaining paranasal sinuses and mastoid air cells are clear. Soft tissues: Small right periorbital hematoma.  Otherwise negative. CT CERVICAL SPINE FINDINGS Alignment: Normal. Skull base and vertebrae: No acute fracture. No primary bone lesion or focal pathologic process. Soft tissues and spinal canal: No prevertebral fluid or swelling. No visible canal hematoma. Disc levels: Moderate to severe degenerative disc disease and uncovertebral hypertrophy at C5-C6 and C6-C7. Scattered mild facet arthropathy throughout the cervical spine. Upper chest: Negative. Other: None. IMPRESSION: 1. No acute intracranial abnormality. 2. Small right periorbital hematoma. No facial bone fracture. 3. No acute cervical spine fracture or traumatic listhesis. Electronically Signed   By: Obie Dredge M.D.   On: 09/29/2023 12:12   CT Cervical Spine Wo Contrast Result Date: 09/29/2023 CLINICAL DATA:  Fall.  Hit head.  Bruising around the right eye. EXAM: CT HEAD WITHOUT CONTRAST CT MAXILLOFACIAL WITHOUT CONTRAST CT CERVICAL SPINE WITHOUT CONTRAST TECHNIQUE: Multidetector CT imaging of the head, cervical spine, and maxillofacial structures were performed using the standard protocol without intravenous contrast. Multiplanar CT image reconstructions of the cervical spine and maxillofacial structures were also generated. RADIATION DOSE REDUCTION: This exam was performed according to the departmental dose-optimization program  which includes automated exposure control, adjustment of the mA and/or kV according to patient size and/or use of iterative reconstruction technique. COMPARISON:  CT head dated April 13, 2008. FINDINGS: CT HEAD FINDINGS Brain: No evidence of acute infarction, hemorrhage, hydrocephalus, extra-axial collection or mass lesion/mass effect. Mild generalized cerebral atrophy. Scattered mild periventricular and subcortical white matter hypodensities are nonspecific, but favored to reflect chronic microvascular ischemic changes. Vascular: Atherosclerotic vascular calcification of the carotid siphons. No hyperdense vessel. Skull: Normal. Negative for fracture or focal lesion. Other: None. CT MAXILLOFACIAL FINDINGS Osseous: No fracture or mandibular dislocation. No destructive process. Orbits: Negative. No traumatic or inflammatory finding. Sinuses: Mild mucosal thickening in the left maxillary sinus. Remaining paranasal sinuses and mastoid air cells are clear. Soft tissues: Small right periorbital hematoma.  Otherwise negative. CT CERVICAL SPINE FINDINGS Alignment: Normal. Skull base and vertebrae: No acute fracture. No primary bone lesion  or focal pathologic process. Soft tissues and spinal canal: No prevertebral fluid or swelling. No visible canal hematoma. Disc levels: Moderate to severe degenerative disc disease and uncovertebral hypertrophy at C5-C6 and C6-C7. Scattered mild facet arthropathy throughout the cervical spine. Upper chest: Negative. Other: None. IMPRESSION: 1. No acute intracranial abnormality. 2. Small right periorbital hematoma. No facial bone fracture. 3. No acute cervical spine fracture or traumatic listhesis. Electronically Signed   By: Obie Dredge M.D.   On: 09/29/2023 12:12   DG Knee 2 Views Right Result Date: 09/29/2023 CLINICAL DATA:  Status post fall. Bilateral hip and knee pain. Right periorbital bruising. EXAM: RIGHT KNEE - 1-2 VIEW; DG HIP (WITH OR WITHOUT PELVIS) 2-3V LEFT; DG HIP  (WITH OR WITHOUT PELVIS) 2-3V RIGHT; LEFT KNEE - 1-2 VIEW COMPARISON:  Pelvic CT 03/30/2022. Pelvic and right hip radiographs 02/11/2017. 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 The hardware is intact without evidence of loosening. No evidence of acute fracture or dislocation. The sacroiliac joints are intact. Right knee: Status post total knee arthroplasty. The hardware is intact without evidence of loosening. No evidence of acute fracture, dislocation or significant knee joint effusion. The soft tissues appear unremarkable. Left knee: Status post total knee arthroplasty. The hardware appears intact without evidence of loosening. No evidence of acute fracture, dislocation or significant knee joint effusion. The soft tissues appear unremarkable. IMPRESSION: 1. No evidence of acute fracture or dislocation in the pelvis, bilateral hips or knees. 2. Postsurgical changes as described without evidence of hardware complication. Electronically Signed   By: Carey Bullocks M.D.   On: 09/29/2023 11:53   DG Knee 2 Views Left Result Date: 09/29/2023 CLINICAL DATA:  Status post fall. Bilateral hip and knee pain. Right periorbital bruising. EXAM: RIGHT KNEE - 1-2 VIEW; DG HIP (WITH OR WITHOUT PELVIS) 2-3V LEFT; DG HIP (WITH OR WITHOUT PELVIS) 2-3V RIGHT; LEFT KNEE - 1-2 VIEW COMPARISON:  Pelvic CT 03/30/2022. Pelvic and right hip radiographs 02/11/2017. 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 The hardware is intact without evidence of loosening. No evidence of acute fracture or dislocation. The sacroiliac joints are intact. Right knee: Status post total knee arthroplasty. The hardware is intact without evidence of loosening. No evidence of acute fracture, dislocation or significant knee  joint effusion. The soft tissues appear unremarkable. Left knee: Status post total knee arthroplasty. The hardware appears intact without evidence of loosening. No evidence of acute fracture, dislocation or significant knee joint effusion. The soft tissues appear unremarkable. IMPRESSION: 1. No evidence of acute fracture or dislocation in the pelvis, bilateral hips or knees. 2. Postsurgical changes as described without evidence of hardware complication. Electronically Signed   By: Carey Bullocks M.D.   On: 09/29/2023 11:53   DG Hip Unilat W or Wo Pelvis 2-3 Views Left Result Date: 09/29/2023 CLINICAL DATA:  Status post fall. Bilateral hip and knee pain. Right periorbital bruising. EXAM: RIGHT KNEE - 1-2 VIEW; DG HIP (WITH OR WITHOUT PELVIS) 2-3V LEFT; DG HIP (WITH OR WITHOUT PELVIS) 2-3V RIGHT; LEFT KNEE - 1-2 VIEW COMPARISON:  Pelvic CT 03/30/2022. Pelvic and right hip radiographs 02/11/2017. 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 The hardware is intact without evidence of  loosening. No evidence of acute fracture or dislocation. The sacroiliac joints are intact. Right knee: Status post total knee arthroplasty. The hardware is intact without evidence of loosening. No evidence of acute fracture, dislocation or significant knee joint effusion. The soft tissues appear unremarkable. Left knee: Status post total knee arthroplasty. The hardware appears intact without evidence of loosening. No evidence of acute fracture, dislocation or significant knee joint effusion. The soft tissues appear unremarkable. IMPRESSION: 1. No evidence of acute fracture or dislocation in the pelvis, bilateral hips or knees. 2. Postsurgical changes as described without evidence of hardware complication. Electronically Signed   By: Carey Bullocks M.D.   On: 09/29/2023 11:53   DG Hip Unilat W or Wo Pelvis 2-3 Views  Right Result Date: 09/29/2023 CLINICAL DATA:  Status post fall. Bilateral hip and knee pain. Right periorbital bruising. EXAM: RIGHT KNEE - 1-2 VIEW; DG HIP (WITH OR WITHOUT PELVIS) 2-3V LEFT; DG HIP (WITH OR WITHOUT PELVIS) 2-3V RIGHT; LEFT KNEE - 1-2 VIEW COMPARISON:  Pelvic CT 03/30/2022. Pelvic and right hip radiographs 02/11/2017. 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 The hardware is intact without evidence of loosening. No evidence of acute fracture or dislocation. The sacroiliac joints are intact. Right knee: Status post total knee arthroplasty. The hardware is intact without evidence of loosening. No evidence of acute fracture, dislocation or significant knee joint effusion. The soft tissues appear unremarkable. Left knee: Status post total knee arthroplasty. The hardware appears intact without evidence of loosening. No evidence of acute fracture, dislocation or significant knee joint effusion. The soft tissues appear unremarkable. IMPRESSION: 1. No evidence of acute fracture or dislocation in the pelvis, bilateral hips or knees. 2. Postsurgical changes as described without evidence of hardware complication. Electronically Signed   By: Carey Bullocks M.D.   On: 09/29/2023 11:53    Procedures Procedures   Medications Ordered in ED Medications  sodium chloride 0.9 % bolus 1,000 mL (0 mLs Intravenous Stopped 09/29/23 1409)    ED Course/ Medical Decision Making/ A&P Clinical Course as of 09/29/23 1522  Sat Sep 29, 2023  1229 CT Head Wo Contrast [CG]    Clinical Course User Index [CG] Al Decant, PA-C   Medical Decision Making Amount and/or Complexity of Data Reviewed Labs: ordered. Radiology: ordered. Decision-making details documented in ED Course.  Risk Decision regarding hospitalization.   83 year old female presents for evaluation.  Please see HPI  for further details.  On exam patient is afebrile and nontachycardic.  Her lung sounds are clear bilaterally, she is not hypoxic.  Her abdomen is soft and compressible throughout.  Neurological examinations at baseline.  No edema to bilateral lower extremities.  She is alert and oriented x 4.  Overall nontoxic.  Will assess patient with CBC, i-STAT Chem-8, CK, urinalysis, troponin x 2, CT of thoracic spine, CT head, CT cervical spine, CT maxillofacial, plan film imaging of bilateral knees, bilateral hips.  Patient CBC has a white count of 13.8, baseline hemoglobin.  I-STAT Chem-8 shows baseline creatinine, no electrolyte derangement.  Patient urinalysis shows trace hemoglobin and leukocytes patient denies dysuria however.  CK elevated to 624 treated with 1 L normal saline.  Most likely due to the fact patient was on the ground for extended period time.  Patient initial troponin 88, delta troponin reveals elevation to 97.  The patient denies any chest pain.  Her EKG is nonischemic.  At this time, patient will be admitted to the hospital for syncopal workup as well as uptrending troponins.  Discussed patient case with Dr. Ella Jubilee, [facility] hospitalist, who has agreed to admit the patient.  Patient admitted to plan.  Patient stable at time of admission.   Final Clinical Impression(s) / ED Diagnoses Final diagnoses:  Syncope, unspecified syncope type  Fall, initial encounter    Rx / DC Orders ED Discharge Orders     None          Al Decant, PA-C 09/29/23 1707    Coral Spikes, DO 09/30/23 1130

## 2023-09-29 NOTE — Assessment & Plan Note (Signed)
 Hold Lipitor rehydrate follow serial CK Patient does endorse recent viral illness will check for COVID for completion

## 2023-09-29 NOTE — ED Notes (Signed)
 Chem 8 completed by RT

## 2023-09-29 NOTE — Assessment & Plan Note (Signed)
 Unclear if true syncope versus mechanical fall patient does not recollect what happened. She clearly did have a head trauma CT head unremarkable Otherwise patient appears to be neurologically intact Troponin noted to be somewhat elevated but stable in the setting of elevated CK Rehydrate Echogram in the morning serial ECG If abnormal echo will need further cardiac workup and evaluation

## 2023-09-30 ENCOUNTER — Observation Stay (HOSPITAL_BASED_OUTPATIENT_CLINIC_OR_DEPARTMENT_OTHER)

## 2023-09-30 DIAGNOSIS — E7800 Pure hypercholesterolemia, unspecified: Secondary | ICD-10-CM | POA: Diagnosis not present

## 2023-09-30 DIAGNOSIS — M6282 Rhabdomyolysis: Secondary | ICD-10-CM

## 2023-09-30 DIAGNOSIS — R7989 Other specified abnormal findings of blood chemistry: Secondary | ICD-10-CM | POA: Diagnosis not present

## 2023-09-30 DIAGNOSIS — Y92009 Unspecified place in unspecified non-institutional (private) residence as the place of occurrence of the external cause: Secondary | ICD-10-CM

## 2023-09-30 DIAGNOSIS — W19XXXA Unspecified fall, initial encounter: Secondary | ICD-10-CM

## 2023-09-30 DIAGNOSIS — E860 Dehydration: Secondary | ICD-10-CM | POA: Diagnosis not present

## 2023-09-30 DIAGNOSIS — I951 Orthostatic hypotension: Secondary | ICD-10-CM | POA: Diagnosis not present

## 2023-09-30 DIAGNOSIS — I10 Essential (primary) hypertension: Secondary | ICD-10-CM

## 2023-09-30 DIAGNOSIS — R8281 Pyuria: Secondary | ICD-10-CM

## 2023-09-30 LAB — COMPREHENSIVE METABOLIC PANEL WITH GFR
ALT: 21 U/L (ref 0–44)
AST: 42 U/L — ABNORMAL HIGH (ref 15–41)
Albumin: 3.3 g/dL — ABNORMAL LOW (ref 3.5–5.0)
Alkaline Phosphatase: 57 U/L (ref 38–126)
Anion gap: 10 (ref 5–15)
BUN: 11 mg/dL (ref 8–23)
CO2: 23 mmol/L (ref 22–32)
Calcium: 9.1 mg/dL (ref 8.9–10.3)
Chloride: 109 mmol/L (ref 98–111)
Creatinine, Ser: 0.85 mg/dL (ref 0.44–1.00)
GFR, Estimated: >60 mL/min (ref >60)
Glucose, Bld: 111 mg/dL — ABNORMAL HIGH (ref 70–99)
Potassium: 3.8 mmol/L (ref 3.5–5.1)
Sodium: 142 mmol/L (ref 135–145)
Total Bilirubin: 0.9 mg/dL (ref 0.0–1.2)
Total Protein: 6 g/dL — ABNORMAL LOW (ref 6.5–8.1)

## 2023-09-30 LAB — ECHOCARDIOGRAM COMPLETE
Area-P 1/2: 3.77 cm2
Height: 64 in
S' Lateral: 2.2 cm
Weight: 2432 [oz_av]

## 2023-09-30 LAB — CBC
HCT: 36.6 % (ref 36.0–46.0)
Hemoglobin: 12.2 g/dL (ref 12.0–15.0)
MCH: 28.4 pg (ref 26.0–34.0)
MCHC: 33.3 g/dL (ref 30.0–36.0)
MCV: 85.3 fL (ref 80.0–100.0)
Platelets: 264 10*3/uL (ref 150–400)
RBC: 4.29 MIL/uL (ref 3.87–5.11)
RDW: 14.8 % (ref 11.5–15.5)
WBC: 7.8 10*3/uL (ref 4.0–10.5)
nRBC: 0 % (ref 0.0–0.2)

## 2023-09-30 LAB — SARS CORONAVIRUS 2 BY RT PCR: SARS Coronavirus 2 by RT PCR: NEGATIVE

## 2023-09-30 LAB — TROPONIN I (HIGH SENSITIVITY): Troponin I (High Sensitivity): 77 ng/L — ABNORMAL HIGH (ref <18)

## 2023-09-30 LAB — CK: Total CK: 708 U/L — ABNORMAL HIGH (ref 38–234)

## 2023-09-30 LAB — T4, FREE: Free T4: 0.8 ng/dL (ref 0.61–1.12)

## 2023-09-30 LAB — MAGNESIUM: Magnesium: 2 mg/dL (ref 1.7–2.4)

## 2023-09-30 LAB — PHOSPHORUS: Phosphorus: 3 mg/dL (ref 2.5–4.6)

## 2023-09-30 MED ORDER — CEFADROXIL 500 MG PO CAPS: 500.0000 mg | ORAL_CAPSULE | Freq: Two times a day (BID) | ORAL | 0 refills | Status: AC

## 2023-09-30 MED ORDER — FAMOTIDINE 20 MG PO TABS: 40.0000 mg | ORAL_TABLET | Freq: Every day | ORAL | Status: DC

## 2023-09-30 MED ORDER — CEFADROXIL 500 MG PO CAPS
500.0000 mg | ORAL_CAPSULE | Freq: Two times a day (BID) | ORAL | Status: DC
  Administered 2023-09-30: 500 mg via ORAL
  Filled 2023-09-30: qty 1

## 2023-09-30 MED ORDER — SODIUM CHLORIDE 0.9 % IV SOLN: INTRAVENOUS | Status: DC

## 2023-09-30 MED ORDER — ACETAMINOPHEN 325 MG PO TABS: 650.0000 mg | ORAL_TABLET | Freq: Four times a day (QID) | ORAL | Status: AC | PRN

## 2023-09-30 NOTE — Discharge Summary (Signed)
 Physician Discharge Summary  Bethany Schneider ZOX:[id number] DOB: Jul 12, 1940 DOA: 09/29/2023  PCP: Verlon Au, MD  Admit date: 09/29/2023 Discharge date: 09/30/2023  Time spent: 55 minutes  Recommendations for Outpatient Follow-up:  Follow-up with Verlon Au, MD in 1 week.  On follow-up patient need a basic metabolic profile done to follow-up on electrolytes and renal function.  Patient will need CK levels checked.  Patient will need a repeat thyroid function studies done in 4 weeks to follow-up on elevated TSH.  Patient's blood pressure will need to be reassessed on follow-up as patient noted to have orthostatic hypotension on admission for syncope and antihypertensive medications held until follow-up with PCP.  Urine cultures which were pending at time of discharge will need to be followed up upon.  Patient instructed statin pending repeat CK levels on follow-up with PCP.   Discharge Diagnoses:  Principal Problem:   Syncope Active Problems:   Orthostatic hypotension   Hypertension   Hyperlipidemia   Sleep disturbance   Fall at home, initial encounter   Syncope and collapse   Elevated troponin   Rhabdomyolysis   Elevated TSH   Pyuria   Discharge Condition: Stable and improved  Diet recommendation: Heart healthy  Filed Weights   09/29/23 1020  Weight: 68.9 kg    History of present illness:  HPI per Dr. Nelson Chimes is a 83 y.o. female with medical history significant of hypertension hyperlipidemia sleep disturbance chronic back pain and unsteady gait, esophageal stricture     Presented with   fall at home  Patient comes from home had a fall unsure of the circumstances hit her head knees she has been lately, lightheaded when she moves.  Patient not on blood thinners she woke up on the floor and was unsure how long she has been down for. Per family and patient she often gets up in the middle of the night to use the bathroom this time she woke up  on the floor but does not remember events leading up to her following but she is sure she hit her head because of a bruising and pain around left orbit Also endorsed neck pain back pain bilateral knee pain on the time of presentation with multiple bruises Otherwise no associated chest pain shortness of breath no nausea no vomiting abdominal pain she lives at home by herself    No hx of CAD Per family pt was up and the next thing she remembers she was on the ground ([place]) She was on the floor from 2 AM to 8 AM she was able to get to the cell phone and called At 2 AM to family unfortunately they did not hear the phone until  8 AM   She has incontinences at baseline and gets up 3-4 times at night   Denies dysuria no fever no chills    She is on chronic Ambien 5 mg po qhs   Denies significant ETOH intake  Does not smoke   Recent Labs   Hospital Course:  #1 syncope and collapse likely secondary to orthostatic hypotension -Patient noted to have presented with a syncopal episode versus a mechanical fall however unable to recollect what happened. -Patient with no focal neurological symptoms, patient not hypoxic, no chest pain, D-dimer negative. -Patient noted to have a significant orthostasis on admission with supine blood pressures of 150/58 with a pulse of 86, standing blood pressure of 88/68 with a pulse of 100. -CT head done on admission negative for  any acute abnormalities. -Patient noted to have elevated troponins however flattened and patient asymptomatic. -2D echo done with EF of 60 to 65%,NWMA, grade 2 DD, no significant valvular abnormalities. -Patient hydrated with IV fluids with repeat orthostatics negative. -Patient improved clinically. -Patient had no further episodes. -Patient seen by PT OT who recommended outpatient therapies. -Patient's antihypertensive medications of Norvasc was held during the hospitalization and will be held on discharge until follow-up with  PCP. -Patient be discharged in stable and improved condition with outpatient follow-up with PCP 1 week postdischarge.  2.  Hypertension -Patient noted to be orthostatic on admission as such antihypertensive medication of Norvasc was held. -Patient's Norvasc will be held on discharge until follow-up with PCP 1 week postdischarge.  3.  Hyperlipidemia -Per patient admitting physician patient noted to be on statin prior to admission however not noted on med rec. -Statin held due to CK elevation. -Patient instructed not to resume statin until follow-up with PCP.  4.  Fall -Unclear whether mechanical fall versus secondary to syncope as patient could not recollect. -Patient seen by PT OT who recommended outpatient therapies.  5.  Elevated troponin -Noted in the setting of dehydration and elevated CK levels. -Patient asymptomatic, denied any chest pain. -Troponins noted to be flattened and trending down. -2D echo obtained with a EF of 60 to 65%, and WMA, grade 2 DD, no valvular abnormalities. -D-dimer obtained noted to be negative. -Outpatient follow-up with PCP.  6.  Mild rhabdomyolysis/elevated CK -Patient noted on admission to have elevated CK level felt likely as patient was noted to be on the ground for several hours and CK noted as high as 819 which trended down to 708 by day of discharge. -Patient with normal renal function. -Patient hydrated with IV fluids. -Close outpatient follow-up with PCP.  7.  Elevated TSH -Patient noted to have elevated TSH of 5.655 with a free T4 of 0.80. -Outpatient follow-up with PCP for repeat TFTs in 4 to 6 weeks.  8.  Pyuria -Patient did endorse some dysuria however there was concern that patient may have had a UTI as the etiology of her possible syncope with increased urination. -Patient received a dose of IV Rocephin during the hospitalization while urine cultures were pending. -Patient be discharged on 2 more days of Duricef to complete a 3-day  course of empiric antibiotic treatment. -Urine cultures will need to be followed up upon per PCP.   Procedures: 2D echo 09/30/2023 CT head CT C-spine 09/29/2023 CT T-spine 09/29/2023 CT maxillofacial 09/29/2023 Chest x-ray 09/29/2023 Plain films of the bilateral hip and pelvis 09/29/2023 Plain films of the left knee 09/29/2023 Plain films of the right knee 09/29/2023  Consultations: None  Discharge Exam: Vitals:   09/30/23 1107 09/30/23 1538  BP: (!) 126/52 (!) 121/53  Pulse: 71 74  Resp: 17 17  Temp: 98.5 F (36.9 C) 98.9 F (37.2 C)  SpO2: 95% 96%    General: NAD Cardiovascular: RRR no murmurs rubs or gallops.  No JVD.  No lower extremity edema. Respiratory: Clear to auscultation bilaterally.  No wheezes, no crackles, no rhonchi.  Fair air movement.  Speaking in full sentences.  Discharge Instructions   Discharge Instructions     Ambulatory referral to Occupational Therapy   Complete by: As directed    Ambulatory referral to Physical Therapy   Complete by: As directed    Diet - low sodium heart healthy   Complete by: As directed    Increase activity slowly   Complete by:  As directed       Allergies as of 09/30/2023       Reactions   Asa [aspirin] Other (See Comments)   Hx gastric ulcer   Cipro [ciprofloxacin Hcl] Other (See Comments)   Sores/ulcers in throat   Motrin [ibuprofen] Other (See Comments)   Hx gastric ulcer Stomach irritation   Percocet [oxycodone-acetaminophen] Nausea Only   Ultram [tramadol] Other (See Comments)   Stomach burning   Penicillins Rash        Medication List     PAUSE taking these medications    amLODipine 5 MG tablet Wait to take this until your doctor or other care provider tells you to start again. Commonly known as: NORVASC Take 1 tablet (5 mg total) by mouth daily.       TAKE these medications    acetaminophen 325 MG tablet Commonly known as: TYLENOL Take 2 tablets (650 mg total) by mouth every 6 (six) hours  as needed for mild pain (pain score 1-3) (or Fever >/= 101).   CALCIUM MAGNESIUM ZINC PO Take 1 tablet by mouth daily.   cefadroxil 500 MG capsule Commonly known as: DURICEF Take 1 capsule (500 mg total) by mouth 2 (two) times daily for 2 days.   famotidine 40 MG tablet Commonly known as: PEPCID Take 1 tablet (40 mg total) by mouth at bedtime.   gabapentin 100 MG capsule Commonly known as: NEURONTIN Take 1 capsule (100 mg total) by mouth 3 (three) times daily. What changed:  how much to take when to take this   mirabegron ER 50 MG Tb24 tablet Commonly known as: Myrbetriq Take 1 tablet (50 mg total) by mouth daily.   Multivitamin Women 50+ Tabs Take 1 tablet by mouth daily.   pantoprazole 40 MG tablet Commonly known as: PROTONIX Take 40 mg by mouth daily.   sertraline 100 MG tablet Commonly known as: ZOLOFT Take 1 tablet by mouth once daily   zolpidem 10 MG tablet Commonly known as: AMBIEN Take 1 tablet (10 mg total) by mouth at bedtime as needed for sleep. What changed: how much to take       Allergies  Allergen Reactions   Asa [Aspirin] Other (See Comments)    Hx gastric ulcer   Cipro [Ciprofloxacin Hcl] Other (See Comments)    Sores/ulcers in throat   Motrin [Ibuprofen] Other (See Comments)    Hx gastric ulcer Stomach irritation   Percocet [Oxycodone-Acetaminophen] Nausea Only   Ultram [Tramadol] Other (See Comments)    Stomach burning   Penicillins Rash    Follow-up Information     Verlon Au, MD. Schedule an appointment as soon as possible for a visit in 1 week(s).   Specialty: Family Medicine Contact information: [address] [place] [address] [phone]                  The results of significant diagnostics from this hospitalization (including imaging, microbiology, ancillary and laboratory) are listed below for reference.    Significant Diagnostic Studies: ECHOCARDIOGRAM COMPLETE Result Date:  09/30/2023    ECHOCARDIOGRAM REPORT   Patient Name:   Bethany Schneider Date of Exam: 09/30/2023 Medical Rec #:  [id number]        Height:       64.0 in Accession #:    [id number]       Weight:       152.0 lb Date of Birth:  [birth year]  BSA:          1.741 m Patient Age:    82 years         BP:           126/52 mmHg Patient Gender: F                HR:           73 bpm. Exam Location:  Inpatient Procedure: 2D Echo, Cardiac Doppler and Color Doppler (Both Spectral and Color            Flow Doppler were utilized during procedure). Indications:    Elevated Troponin  History:        Patient has no prior history of Echocardiogram examinations.  Sonographer:    Harriette Bouillon RDCS Referring Phys: [id number] ANASTASSIA DOUTOVA IMPRESSIONS  1. Left ventricular ejection fraction, by estimation, is 60 to 65%. The left ventricle has normal function. The left ventricle has no regional wall motion abnormalities. There is moderate asymmetric left ventricular hypertrophy of the basal-septal segment. Left ventricular diastolic parameters are consistent with Grade II diastolic dysfunction (pseudonormalization).  2. Right ventricular systolic function is normal. The right ventricular size is normal. Tricuspid regurgitation signal is inadequate for assessing PA pressure.  3. Left atrial size was mildly dilated.  4. The mitral valve is degenerative. Trivial mitral valve regurgitation. No evidence of mitral stenosis.  5. The aortic valve is grossly normal. There is mild calcification of the aortic valve. Aortic valve regurgitation is not visualized. Aortic valve sclerosis is present, with no evidence of aortic valve stenosis.  6. The inferior vena cava is dilated in size with <50% respiratory variability, suggesting right atrial pressure of 15 mmHg. FINDINGS  Left Ventricle: Left ventricular ejection fraction, by estimation, is 60 to 65%. The left ventricle has normal function. The left ventricle has no regional wall motion abnormalities.  The left ventricular internal cavity size was normal in size. There is  moderate asymmetric left ventricular hypertrophy of the basal-septal segment. Left ventricular diastolic parameters are consistent with Grade II diastolic dysfunction (pseudonormalization). Right Ventricle: The right ventricular size is normal. No increase in right ventricular wall thickness. Right ventricular systolic function is normal. Tricuspid regurgitation signal is inadequate for assessing PA pressure. Left Atrium: Left atrial size was mildly dilated. Right Atrium: Right atrial size was normal in size. Pericardium: There is no evidence of pericardial effusion. Mitral Valve: The mitral valve is degenerative in appearance. Mild to moderate mitral annular calcification. Trivial mitral valve regurgitation. No evidence of mitral valve stenosis. Tricuspid Valve: The tricuspid valve is normal in structure. Tricuspid valve regurgitation is trivial. No evidence of tricuspid stenosis. Aortic Valve: The aortic valve is grossly normal. There is mild calcification of the aortic valve. Aortic valve regurgitation is not visualized. Aortic valve sclerosis is present, with no evidence of aortic valve stenosis. Pulmonic Valve: The pulmonic valve was normal in structure. Pulmonic valve regurgitation is trivial. No evidence of pulmonic stenosis. Aorta: The aortic root is normal in size and structure. Venous: The inferior vena cava is dilated in size with less than 50% respiratory variability, suggesting right atrial pressure of 15 mmHg. IAS/Shunts: No atrial level shunt detected by color flow Doppler.  LEFT VENTRICLE PLAX 2D LVIDd:         4.30 cm   Diastology LVIDs:         2.20 cm   LV e' medial:    6.64 cm/s LV PW:  0.90 cm   LV E/e' medial:  14.0 LV IVS:        0.90 cm   LV e' lateral:   8.59 cm/s LVOT diam:     1.90 cm   LV E/e' lateral: 10.8 LV SV:         74 LV SV Index:   43 LVOT Area:     2.84 cm  RIGHT VENTRICLE             IVC RV S prime:      16.00 cm/s  IVC diam: 2.40 cm TAPSE (M-mode): 1.8 cm LEFT ATRIUM             Index        RIGHT ATRIUM          Index LA diam:        3.50 cm 2.01 cm/m   RA Area:     8.43 cm LA Vol (A2C):   38.9 ml 22.34 ml/m  RA Volume:   13.20 ml 7.58 ml/m LA Vol (A4C):   62.5 ml 35.90 ml/m LA Biplane Vol: 50.8 ml 29.18 ml/m  AORTIC VALVE LVOT Vmax:   93.40 cm/s LVOT Vmean:  65.600 cm/s LVOT VTI:    0.262 m  AORTA Ao Root diam: 2.60 cm Ao Asc diam:  2.90 cm MITRAL VALVE MV Area (PHT): 3.77 cm    SHUNTS MV Decel Time: 201 msec    Systemic VTI:  0.26 m MV E velocity: 92.80 cm/s  Systemic Diam: 1.90 cm MV A velocity: 94.90 cm/s MV E/A ratio:  0.98 Weston Brass MD Electronically signed by Weston Brass MD Signature Date/Time: 09/30/2023/3:47:39 PM    Final    DG Chest 2 View Result Date: 09/29/2023 CLINICAL DATA:  Syncope EXAM: CHEST - 2 VIEW COMPARISON:  05/14/2023 FINDINGS: Stable scarring at the right lung base. Lungs are otherwise clear. No pneumothorax or pleural effusion. Cardiac size within limits. Pulmonary vascularity is normal. No acute bone abnormality. IMPRESSION: No active cardiopulmonary disease. Electronically Signed   By: Helyn Numbers M.D.   On: 09/29/2023 22:30   CT Thoracic Spine Wo Contrast Result Date: 09/29/2023 CLINICAL DATA:  The fall. EXAM: CT THORACIC SPINE WITHOUT CONTRAST TECHNIQUE: Multidetector CT images of the thoracic were obtained using the standard protocol without intravenous contrast. RADIATION DOSE REDUCTION: This exam was performed according to the departmental dose-optimization program which includes automated exposure control, adjustment of the mA and/or kV according to patient size and/or use of iterative reconstruction technique. COMPARISON:  CT chest dated September 04, 2005. FINDINGS: Alignment: Normal. Vertebrae: No acute fracture or focal pathologic process. Paraspinal and other soft tissues: Cardiomegaly. Coronary, aortic arch, and branch vessel atherosclerotic vascular  disease. Small calculus in the lower pole of the left kidney. Disc levels: Mild degenerative disc disease in the midthoracic spine. No high-grade stenosis. IMPRESSION: 1. No acute osseous abnormality. Electronically Signed   By: Obie Dredge M.D.   On: 09/29/2023 12:18   CT Head Wo Contrast Result Date: 09/29/2023 CLINICAL DATA:  Fall.  Hit head.  Bruising around the right eye. EXAM: CT HEAD WITHOUT CONTRAST CT MAXILLOFACIAL WITHOUT CONTRAST CT CERVICAL SPINE WITHOUT CONTRAST TECHNIQUE: Multidetector CT imaging of the head, cervical spine, and maxillofacial structures were performed using the standard protocol without intravenous contrast. Multiplanar CT image reconstructions of the cervical spine and maxillofacial structures were also generated. RADIATION DOSE REDUCTION: This exam was performed according to the departmental dose-optimization program which includes automated exposure control, adjustment of the  mA and/or kV according to patient size and/or use of iterative reconstruction technique. COMPARISON:  CT head dated April 13, 2008. FINDINGS: CT HEAD FINDINGS Brain: No evidence of acute infarction, hemorrhage, hydrocephalus, extra-axial collection or mass lesion/mass effect. Mild generalized cerebral atrophy. Scattered mild periventricular and subcortical white matter hypodensities are nonspecific, but favored to reflect chronic microvascular ischemic changes. Vascular: Atherosclerotic vascular calcification of the carotid siphons. No hyperdense vessel. Skull: Normal. Negative for fracture or focal lesion. Other: None. CT MAXILLOFACIAL FINDINGS Osseous: No fracture or mandibular dislocation. No destructive process. Orbits: Negative. No traumatic or inflammatory finding. Sinuses: Mild mucosal thickening in the left maxillary sinus. Remaining paranasal sinuses and mastoid air cells are clear. Soft tissues: Small right periorbital hematoma.  Otherwise negative. CT CERVICAL SPINE FINDINGS Alignment:  Normal. Skull base and vertebrae: No acute fracture. No primary bone lesion or focal pathologic process. Soft tissues and spinal canal: No prevertebral fluid or swelling. No visible canal hematoma. Disc levels: Moderate to severe degenerative disc disease and uncovertebral hypertrophy at C5-C6 and C6-C7. Scattered mild facet arthropathy throughout the cervical spine. Upper chest: Negative. Other: None. IMPRESSION: 1. No acute intracranial abnormality. 2. Small right periorbital hematoma. No facial bone fracture. 3. No acute cervical spine fracture or traumatic listhesis. Electronically Signed   By: Obie Dredge M.D.   On: 09/29/2023 12:12   CT Maxillofacial Wo Contrast Result Date: 09/29/2023 CLINICAL DATA:  Fall.  Hit head.  Bruising around the right eye. EXAM: CT HEAD WITHOUT CONTRAST CT MAXILLOFACIAL WITHOUT CONTRAST CT CERVICAL SPINE WITHOUT CONTRAST TECHNIQUE: Multidetector CT imaging of the head, cervical spine, and maxillofacial structures were performed using the standard protocol without intravenous contrast. Multiplanar CT image reconstructions of the cervical spine and maxillofacial structures were also generated. RADIATION DOSE REDUCTION: This exam was performed according to the departmental dose-optimization program which includes automated exposure control, adjustment of the mA and/or kV according to patient size and/or use of iterative reconstruction technique. COMPARISON:  CT head dated April 13, 2008. FINDINGS: CT HEAD FINDINGS Brain: No evidence of acute infarction, hemorrhage, hydrocephalus, extra-axial collection or mass lesion/mass effect. Mild generalized cerebral atrophy. Scattered mild periventricular and subcortical white matter hypodensities are nonspecific, but favored to reflect chronic microvascular ischemic changes. Vascular: Atherosclerotic vascular calcification of the carotid siphons. No hyperdense vessel. Skull: Normal. Negative for fracture or focal lesion. Other: None. CT  MAXILLOFACIAL FINDINGS Osseous: No fracture or mandibular dislocation. No destructive process. Orbits: Negative. No traumatic or inflammatory finding. Sinuses: Mild mucosal thickening in the left maxillary sinus. Remaining paranasal sinuses and mastoid air cells are clear. Soft tissues: Small right periorbital hematoma.  Otherwise negative. CT CERVICAL SPINE FINDINGS Alignment: Normal. Skull base and vertebrae: No acute fracture. No primary bone lesion or focal pathologic process. Soft tissues and spinal canal: No prevertebral fluid or swelling. No visible canal hematoma. Disc levels: Moderate to severe degenerative disc disease and uncovertebral hypertrophy at C5-C6 and C6-C7. Scattered mild facet arthropathy throughout the cervical spine. Upper chest: Negative. Other: None. IMPRESSION: 1. No acute intracranial abnormality. 2. Small right periorbital hematoma. No facial bone fracture. 3. No acute cervical spine fracture or traumatic listhesis. Electronically Signed   By: Obie Dredge M.D.   On: 09/29/2023 12:12   CT Cervical Spine Wo Contrast Result Date: 09/29/2023 CLINICAL DATA:  Fall.  Hit head.  Bruising around the right eye. EXAM: CT HEAD WITHOUT CONTRAST CT MAXILLOFACIAL WITHOUT CONTRAST CT CERVICAL SPINE WITHOUT CONTRAST TECHNIQUE: Multidetector CT imaging of the head, cervical spine, and maxillofacial  structures were performed using the standard protocol without intravenous contrast. Multiplanar CT image reconstructions of the cervical spine and maxillofacial structures were also generated. RADIATION DOSE REDUCTION: This exam was performed according to the departmental dose-optimization program which includes automated exposure control, adjustment of the mA and/or kV according to patient size and/or use of iterative reconstruction technique. COMPARISON:  CT head dated April 13, 2008. FINDINGS: CT HEAD FINDINGS Brain: No evidence of acute infarction, hemorrhage, hydrocephalus, extra-axial collection  or mass lesion/mass effect. Mild generalized cerebral atrophy. Scattered mild periventricular and subcortical white matter hypodensities are nonspecific, but favored to reflect chronic microvascular ischemic changes. Vascular: Atherosclerotic vascular calcification of the carotid siphons. No hyperdense vessel. Skull: Normal. Negative for fracture or focal lesion. Other: None. CT MAXILLOFACIAL FINDINGS Osseous: No fracture or mandibular dislocation. No destructive process. Orbits: Negative. No traumatic or inflammatory finding. Sinuses: Mild mucosal thickening in the left maxillary sinus. Remaining paranasal sinuses and mastoid air cells are clear. Soft tissues: Small right periorbital hematoma.  Otherwise negative. CT CERVICAL SPINE FINDINGS Alignment: Normal. Skull base and vertebrae: No acute fracture. No primary bone lesion or focal pathologic process. Soft tissues and spinal canal: No prevertebral fluid or swelling. No visible canal hematoma. Disc levels: Moderate to severe degenerative disc disease and uncovertebral hypertrophy at C5-C6 and C6-C7. Scattered mild facet arthropathy throughout the cervical spine. Upper chest: Negative. Other: None. IMPRESSION: 1. No acute intracranial abnormality. 2. Small right periorbital hematoma. No facial bone fracture. 3. No acute cervical spine fracture or traumatic listhesis. Electronically Signed   By: Obie Dredge M.D.   On: 09/29/2023 12:12   DG Knee 2 Views Right Result Date: 09/29/2023 CLINICAL DATA:  Status post fall. Bilateral hip and knee pain. Right periorbital bruising. EXAM: RIGHT KNEE - 1-2 VIEW; DG HIP (WITH OR WITHOUT PELVIS) 2-3V LEFT; DG HIP (WITH OR WITHOUT PELVIS) 2-3V RIGHT; LEFT KNEE - 1-2 VIEW COMPARISON:  Pelvic CT 03/30/2022. Pelvic and right hip radiographs 02/11/2017. 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 The hardware is  intact without evidence of loosening. No evidence of acute fracture or dislocation. The sacroiliac joints are intact. Right knee: Status post total knee arthroplasty. The hardware is intact without evidence of loosening. No evidence of acute fracture, dislocation or significant knee joint effusion. The soft tissues appear unremarkable. Left knee: Status post total knee arthroplasty. The hardware appears intact without evidence of loosening. No evidence of acute fracture, dislocation or significant knee joint effusion. The soft tissues appear unremarkable. IMPRESSION: 1. No evidence of acute fracture or dislocation in the pelvis, bilateral hips or knees. 2. Postsurgical changes as described without evidence of hardware complication. Electronically Signed   By: Carey Bullocks M.D.   On: 09/29/2023 11:53   DG Knee 2 Views Left Result Date: 09/29/2023 CLINICAL DATA:  Status post fall. Bilateral hip and knee pain. Right periorbital bruising. EXAM: RIGHT KNEE - 1-2 VIEW; DG HIP (WITH OR WITHOUT PELVIS) 2-3V LEFT; DG HIP (WITH OR WITHOUT PELVIS) 2-3V RIGHT; LEFT KNEE - 1-2 VIEW COMPARISON:  Pelvic CT 03/30/2022. Pelvic and right hip radiographs 02/11/2017. 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 The hardware is intact without evidence of loosening. No evidence of acute fracture or dislocation. The sacroiliac joints are intact. Right knee: Status post total knee arthroplasty. The hardware is  intact without evidence of loosening. No evidence of acute fracture, dislocation or significant knee joint effusion. The soft tissues appear unremarkable. Left knee: Status post total knee arthroplasty. The hardware appears intact without evidence of loosening. No evidence of acute fracture, dislocation or significant knee joint effusion. The soft tissues appear unremarkable. IMPRESSION: 1. No evidence of acute  fracture or dislocation in the pelvis, bilateral hips or knees. 2. Postsurgical changes as described without evidence of hardware complication. Electronically Signed   By: Carey Bullocks M.D.   On: 09/29/2023 11:53   DG Hip Unilat W or Wo Pelvis 2-3 Views Left Result Date: 09/29/2023 CLINICAL DATA:  Status post fall. Bilateral hip and knee pain. Right periorbital bruising. EXAM: RIGHT KNEE - 1-2 VIEW; DG HIP (WITH OR WITHOUT PELVIS) 2-3V LEFT; DG HIP (WITH OR WITHOUT PELVIS) 2-3V RIGHT; LEFT KNEE - 1-2 VIEW COMPARISON:  Pelvic CT 03/30/2022. Pelvic and right hip radiographs 02/11/2017. 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 The hardware is intact without evidence of loosening. No evidence of acute fracture or dislocation. The sacroiliac joints are intact. Right knee: Status post total knee arthroplasty. The hardware is intact without evidence of loosening. No evidence of acute fracture, dislocation or significant knee joint effusion. The soft tissues appear unremarkable. Left knee: Status post total knee arthroplasty. The hardware appears intact without evidence of loosening. No evidence of acute fracture, dislocation or significant knee joint effusion. The soft tissues appear unremarkable. IMPRESSION: 1. No evidence of acute fracture or dislocation in the pelvis, bilateral hips or knees. 2. Postsurgical changes as described without evidence of hardware complication. Electronically Signed   By: Carey Bullocks M.D.   On: 09/29/2023 11:53   DG Hip Unilat W or Wo Pelvis 2-3 Views Right Result Date: 09/29/2023 CLINICAL DATA:  Status post fall. Bilateral hip and knee pain. Right periorbital bruising. EXAM: RIGHT KNEE - 1-2 VIEW; DG HIP (WITH OR WITHOUT PELVIS) 2-3V LEFT; DG HIP (WITH OR WITHOUT PELVIS) 2-3V RIGHT; LEFT KNEE - 1-2 VIEW COMPARISON:  Pelvic CT 03/30/2022. Pelvic and right hip  radiographs 02/11/2017. 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 The hardware is intact without evidence of loosening. No evidence of acute fracture or dislocation. The sacroiliac joints are intact. Right knee: Status post total knee arthroplasty. The hardware is intact without evidence of loosening. No evidence of acute fracture, dislocation or significant knee joint effusion. The soft tissues appear unremarkable. Left knee: Status post total knee arthroplasty. The hardware appears intact without evidence of loosening. No evidence of acute fracture, dislocation or significant knee joint effusion. The soft tissues appear unremarkable. IMPRESSION: 1. No evidence of acute fracture or dislocation in the pelvis, bilateral hips or knees. 2. Postsurgical changes as described without evidence of hardware complication. Electronically Signed   By: Carey Bullocks M.D.   On: 09/29/2023 11:53    Microbiology: Recent Results (from the past 240 hours)  SARS Coronavirus 2 by RT PCR (hospital order, performed in [facility] hospital lab) *cepheid single result test* Anterior Nasal Swab     Status: None   Collection Time: 09/30/23  6:00 AM   Specimen: Anterior Nasal Swab  Result Value Ref Range Status   SARS Coronavirus 2 by RT PCR NEGATIVE NEGATIVE Final    Comment: Performed at [facility],  [address], [place], [place] [address]     Labs: Basic Metabolic Panel: Recent Labs  Lab 09/29/23 1049 09/29/23 2012 09/30/23 0604  NA 137 137 142  K 3.6 3.6 3.8  CL 102 107 109  CO2  --  23 23  GLUCOSE 133* 121* 111*  BUN 19 15 11   CREATININE 0.90 0.94 0.85  CALCIUM  --  9.5 9.1  MG  --  1.9 2.0  PHOS  --  3.0 3.0   Liver Function Tests: Recent Labs  Lab 09/29/23 2012 09/30/23 0604  AST 44* 42*  ALT 21 21  ALKPHOS 64 57  BILITOT 0.8 0.9  PROT 6.5 6.0*  ALBUMIN 3.5 3.3*    No results for input(s): "LIPASE", "AMYLASE" in the last 168 hours. No results for input(s): "AMMONIA" in the last 168 hours. CBC: Recent Labs  Lab 09/29/23 1032 09/29/23 1049 09/29/23 2012 09/30/23 0604  WBC 13.8*  --  10.6* 7.8  NEUTROABS  --   --  7.1  --   HGB 13.1 14.3 12.8 12.2  HCT 38.9 42.0 37.6 36.6  MCV 84.2  --  84.7 85.3  PLT 290  --  304 264   Cardiac Enzymes: Recent Labs  Lab 09/29/23 1032 09/29/23 2012 09/30/23 0604  CKTOTAL 624* 819* 708*   BNP: BNP (last 3 results) No results for input(s): "BNP" in the last 8760 hours.  ProBNP (last 3 results) No results for input(s): "PROBNP" in the last 8760 hours.  CBG: No results for input(s): "GLUCAP" in the last 168 hours.     Signed:  Ramiro Harvest MD.  [facility] 09/30/2023, 4:49 PM

## 2023-09-30 NOTE — Progress Notes (Signed)
  Echocardiogram 2D Echocardiogram has been performed.  Leda Roys RDCS 09/30/2023, 3:18 PM

## 2023-09-30 NOTE — Progress Notes (Addendum)
 SATURATION QUALIFICATIONS: (This note is used to comply with regulatory documentation for home oxygen)  Patient Saturations on Room Air at Rest = 98%  Patient Saturations on Room Air while Ambulating = 99%  Patient Saturations on 0 Liters of oxygen while Ambulating = 99%  Please briefly explain why patient needs home oxygen: N/A

## 2023-09-30 NOTE — Evaluation (Addendum)
 Occupational Therapy Evaluation Patient Details Name: Bethany Schneider MRN: [id number] DOB: [birth year] Today's Date: 09/30/2023   History of Present Illness   Bethany Schneider is a 83 y.o. female presenting to ED for evaluation of fall striking head and L orbit with c/o neck pain, thoracic pain, bilateral knee and hip pain. CT on 09/29/2023 showed small right periorbital hematoma. 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 At baseline, pt lives at home alone, completes all ADLs/IADLs and functional mobility independently. Upon evaluation, pt was limited by deficits listed below. Pt completed bed mobility with MOD I and functional mobility using RW with supervision and verbal cues for safety. Pt reports not using RW since 2021-2022. Additional education provided for safe RW use. Pt verbalized understanding. Pt completed ADLs (grooming, toileting, lower body dressing) with supervision. No LOB observed when performing functional bending and reaching and standing unsupported. Pt does not have acute OT needs. OT will sign off and recommends pt discharge home with family support without follow-up OT services.      If plan is discharge home, recommend the following:   A little help with bathing/dressing/bathroom;A little help with walking and/or transfers;Assist for transportation;Help with stairs or ramp for entrance     Functional Status Assessment   Patient has had a recent decline in their functional status and demonstrates the ability to make significant improvements in function in a reasonable and predictable amount of time.     Equipment Recommendations   None recommended by OT     Recommendations for Other Services         Precautions/Restrictions   Precautions Precautions: Fall Recall of Precautions/Restrictions: Intact Restrictions Weight Bearing  Restrictions Per Provider Order: No     Mobility Bed Mobility Overal bed mobility: Modified Independent             General bed mobility comments: HOB elevated. No physical assistance required    Transfers Overall transfer level: Needs assistance Equipment used: Rolling walker (2 wheels) Transfers: Sit to/from Stand Sit to Stand: Supervision           General transfer comment: Supervision provided for safety      Balance Overall balance assessment: Mild deficits observed, not formally tested                                         ADL either performed or assessed with clinical judgement   ADL Overall ADL's : Needs assistance/impaired Eating/Feeding: Independent   Grooming: Wash/dry hands;Supervision/safety;Standing   Upper Body Bathing: Supervision/ safety;Sitting   Lower Body Bathing: Supervison/ safety;Sitting/lateral leans;Sit to/from stand   Upper Body Dressing : Supervision/safety;Sitting   Lower Body Dressing: Supervision/safety;Sit to/from stand   Toilet Transfer: Supervision/safety;Rolling walker (2 wheels);Regular Toilet;Ambulation;Cueing for safety   Toileting- Clothing Manipulation and Hygiene: Supervision/safety;Sitting/lateral lean;Sit to/from stand       Functional mobility during ADLs: Supervision/safety;Rolling walker (2 wheels);Cueing for safety General ADL Comments: Pt required verbal cues for safety using RW when performing pivots towards surface. Supervision provided for safety     Vision Baseline Vision/History: 0 No visual deficits Ability to See in Adequate Light: 0 Adequate Patient Visual Report: No change from baseline Vision Assessment?: No apparent visual deficits     Perception Perception: Within Functional Limits  Praxis Praxis: Not tested       Pertinent Vitals/Pain Pain Assessment Pain Assessment: No/denies pain     Extremity/Trunk Assessment Upper Extremity Assessment Upper Extremity  Assessment: Right hand dominant   Lower Extremity Assessment Lower Extremity Assessment: Defer to PT evaluation   Cervical / Trunk Assessment Cervical / Trunk Assessment: Normal   Communication Communication Communication: No apparent difficulties   Cognition Arousal: Alert Behavior During Therapy: WFL for tasks assessed/performed Cognition: No apparent impairments             OT - Cognition Comments: Pt able AOx4, able to list months in reverse order, count backwards from 20-1, and demonstrated safety with medication management                 Following commands: Intact       Cueing  General Comments   Cueing Techniques: Verbal cues  VSS on RA; daughter at bedside   Exercises     Shoulder Instructions      Home Living Family/patient expects to be discharged to:: Private residence Living Arrangements: Alone Available Help at Discharge: Family Type of Home: House Home Access: Ramped entrance (with non-slip tape and yellow tape around perimeter; no rails)     Home Layout: One level     Bathroom Shower/Tub: Producer, television/film/video: Standard     Home Equipment: Agricultural consultant (2 wheels);Shower seat;BSC/3in1;Hand held shower head;Cane - single point   Additional Comments: Daughter will be present to provide assistance once discharged home      Prior Functioning/Environment Prior Level of Function : Independent/Modified Independent;Driving               ADLs Comments: "very independent" has not used RW much since 2021-2022    OT Problem List: Decreased strength;Decreased activity tolerance;Impaired balance (sitting and/or standing);Decreased knowledge of use of DME or AE   OT Treatment/Interventions:        OT Goals(Current goals can be found in the care plan section)   Acute Rehab OT Goals Patient Stated Goal: to go home OT Goal Formulation: With patient Time For Goal Achievement: 09/30/23 Potential to Achieve Goals: Good    OT Frequency:       Co-evaluation              AM-PAC OT "6 Clicks" Daily Activity     Outcome Measure Help from another person eating meals?: None Help from another person taking care of personal grooming?: A Little Help from another person toileting, which includes using toliet, bedpan, or urinal?: A Little Help from another person bathing (including washing, rinsing, drying)?: A Little Help from another person to put on and taking off regular upper body clothing?: A Little Help from another person to put on and taking off regular lower body clothing?: A Little 6 Click Score: 19   End of Session Equipment Utilized During Treatment: Gait belt;Rolling walker (2 wheels) Nurse Communication: Mobility status  Activity Tolerance: Patient tolerated treatment well Patient left: in bed;with call bell/phone within reach;with family/visitor present  OT Visit Diagnosis: Unsteadiness on feet (R26.81);Other abnormalities of gait and mobility (R26.89);History of falling (Z91.81);Muscle weakness (generalized) (M62.81)                Time: 1610-9604 OT Time Calculation (min): 20 min Charges:  OT General Charges $OT Visit: 1 Visit OT Evaluation $OT Eval Low Complexity: 1 Low  [address] [facility]  Kyl Givler 09/30/2023, 1:03 PM

## 2023-09-30 NOTE — Progress Notes (Signed)
 Patient ready for discharge to home; discharge instructions given and reviewed; Rx sent electronically; patient getting dressed with assist of her daughter; will discharge out via wheelchair; accompanied home by her daughter.

## 2023-09-30 NOTE — Care Management Obs Status (Signed)
 MEDICARE OBSERVATION STATUS NOTIFICATION   Patient Details  Name: MASSIE COGLIANO MRN: [id number] Date of Birth: 06-07-1941   Medicare Observation Status Notification Given:  Yes    Larraine Argo G., RN 09/30/2023, 8:42 AM

## 2023-09-30 NOTE — Evaluation (Signed)
 Physical Therapy Evaluation Patient Details Name: Bethany Schneider MRN: [id number] DOB: [birth year] Today's Date: 09/30/2023  History of Present Illness  Bethany Schneider is a 83 y.o. female presenting to ED for evaluation of fall striking head and L orbit with c/o neck pain, thoracic pain, bilateral knee and hip pain. CT on 09/29/2023 showed small right periorbital hematoma. 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   Clinical Impression  Pt in bed upon arrival of PT, agreeable to evaluation at this time. Prior to admission the pt was ambulating without DME, and reports no other falls since 2022. She lives alone (but with daughter nearby) and typically is independent with ADLs and IADLs. The pt was able to complete sit-stand transfers and ambulation with good stability and supervision when using RW, but progressed to moments of minA when fatigued ambulating without UE support. The pt and her daughter were educated on measures for increased safety at home, especially as pt completing fatiguing tasks and for intermittent use of RW as needed for increased support. Recommend continued skilled PT for balance intervention to further improve endurance, balance, and reaction time to reduce risk of falls at home.   Dynamic Gait Index (DGI): 14/24 (<19 indicates increased risk for falls)     If plan is discharge home, recommend the following: A little help with walking and/or transfers;A little help with bathing/dressing/bathroom;Assist for transportation;Help with stairs or ramp for entrance   Can travel by private vehicle        Equipment Recommendations None recommended by PT  Recommendations for Other Services       Functional Status Assessment Patient has had a recent decline in their functional status and demonstrates the ability to make significant improvements in function in a reasonable and predictable amount of time.     Precautions /  Restrictions Precautions Precautions: Fall Recall of Precautions/Restrictions: Intact Restrictions Weight Bearing Restrictions Per Provider Order: No      Mobility  Bed Mobility Overal bed mobility: Modified Independent             General bed mobility comments: HOB elevated. No physical assistance required    Transfers Overall transfer level: Needs assistance Equipment used: Rolling walker (2 wheels), None Transfers: Sit to/from Stand Sit to Stand: Supervision           General transfer comment: Supervision provided for safety    Ambulation/Gait Ambulation/Gait assistance: Supervision, Contact guard assist, Min assist Gait Distance (Feet): 15 Feet (+ 250) Assistive device: Rolling walker (2 wheels), None Gait Pattern/deviations: Step-through pattern, Drifts right/left, Staggering right Gait velocity: decreased Gait velocity interpretation: <1.8 ft/sec, indicate of risk for recurrent falls   General Gait Details: supervision with RW, initially supervision to CGA without RW inhallway, but with fatigue pt needing minA due ot frequent lateral staggering steps and lOB     Balance Overall balance assessment: Mild deficits observed, not formally tested                               Standardized Balance Assessment Standardized Balance Assessment : Dynamic Gait Index   Dynamic Gait Index Level Surface: Normal Change in Gait Speed: Normal Gait with Horizontal Head Turns: Severe Impairment Gait with Vertical Head Turns: Mild Impairment Gait and Pivot Turn: Moderate Impairment Step Over Obstacle: Moderate Impairment Step Around Obstacles: Mild Impairment Steps: Mild Impairment Total Score: 14       Pertinent  Vitals/Pain Pain Assessment Pain Assessment: No/denies pain    Home Living Family/patient expects to be discharged to:: Private residence Living Arrangements: Alone Available Help at Discharge: Family Type of Home: House Home Access:  Ramped entrance (with non-slip tape and yellow tape around perimeter; no rails)       Home Layout: One level Home Equipment: Agricultural consultant (2 wheels);Shower seat;BSC/3in1;Hand held shower head;Cane - single point Additional Comments: Daughter will be present to provide assistance once discharged home    Prior Function Prior Level of Function : Independent/Modified Independent;Driving             Mobility Comments: no use of DME, no other falls. less exercise through winter but hoping to get back into it ADLs Comments: "very independent" has not used RW much since 2021-2022     Extremity/Trunk Assessment   Upper Extremity Assessment Upper Extremity Assessment: Defer to OT evaluation    Lower Extremity Assessment Lower Extremity Assessment: Overall WFL for tasks assessed    Cervical / Trunk Assessment Cervical / Trunk Assessment: Normal  Communication   Communication Communication: No apparent difficulties    Cognition Arousal: Alert Behavior During Therapy: WFL for tasks assessed/performed   PT - Cognitive impairments: No apparent impairments                         Following commands: Intact       Cueing Cueing Techniques: Verbal cues     General Comments General comments (skin integrity, edema, etc.): daughter present and supportive, VSS        Assessment/Plan    PT Assessment Patient needs continued PT services  PT Problem List Decreased activity tolerance;Decreased balance;Decreased mobility;Decreased safety awareness       PT Treatment Interventions DME instruction;Gait training;Stair training;Functional mobility training;Therapeutic activities;Therapeutic exercise;Balance training;Patient/family education    PT Goals (Current goals can be found in the Care Plan section)  Acute Rehab PT Goals Patient Stated Goal: return home and not fall, get back into exercising at [facility] PT Goal Formulation: With patient/family Time For Goal  Achievement: 10/14/23 Potential to Achieve Goals: Good    Frequency Min 2X/week        AM-PAC PT "6 Clicks" Mobility  Outcome Measure Help needed turning from your back to your side while in a flat bed without using bedrails?: None Help needed moving from lying on your back to sitting on the side of a flat bed without using bedrails?: None Help needed moving to and from a bed to a chair (including a wheelchair)?: A Little Help needed standing up from a chair using your arms (e.g., wheelchair or bedside chair)?: A Little Help needed to walk in hospital room?: A Little Help needed climbing 3-5 steps with a railing? : A Lot 6 Click Score: 19    End of Session Equipment Utilized During Treatment: Gait belt Activity Tolerance: Patient tolerated treatment well Patient left: in bed;with call bell/phone within reach;with family/visitor present Nurse Communication: Mobility status PT Visit Diagnosis: Unsteadiness on feet (R26.81)    Time: 1610-9604 PT Time Calculation (min) (ACUTE ONLY): 33 min   Charges:   PT Evaluation $PT Eval Moderate Complexity: 1 Mod PT Treatments $Gait Training: 8-22 mins PT General Charges $$ ACUTE PT VISIT: 1 Visit         Vickki Muff, PT, DPT   Acute Rehabilitation Department Office [phone] Secure Chat Communication Preferred  Ronnie Derby 09/30/2023, 1:21 PM

## 2023-10-01 LAB — URINE CULTURE: Culture: >=100000 — AB

## 2023-10-01 LAB — T3, FREE: T3, Free: 2.1 pg/mL (ref 2.0–4.4)

## 2023-10-28 ENCOUNTER — Encounter (HOSPITAL_BASED_OUTPATIENT_CLINIC_OR_DEPARTMENT_OTHER): Payer: Self-pay | Admitting: Emergency Medicine

## 2023-10-28 ENCOUNTER — Emergency Department (HOSPITAL_BASED_OUTPATIENT_CLINIC_OR_DEPARTMENT_OTHER)

## 2023-10-28 ENCOUNTER — Other Ambulatory Visit: Payer: Self-pay

## 2023-10-28 ENCOUNTER — Emergency Department (HOSPITAL_BASED_OUTPATIENT_CLINIC_OR_DEPARTMENT_OTHER)
Admission: EM | Admit: 2023-10-28 | Discharge: 2023-10-28 | Disposition: A | Discharge status: Home / Self Care | Attending: Emergency Medicine | Admitting: Emergency Medicine

## 2023-10-28 DIAGNOSIS — W19XXXA Unspecified fall, initial encounter: Secondary | ICD-10-CM

## 2023-10-28 DIAGNOSIS — R262 Difficulty in walking, not elsewhere classified: Secondary | ICD-10-CM | POA: Insufficient documentation

## 2023-10-28 DIAGNOSIS — R739 Hyperglycemia, unspecified: Secondary | ICD-10-CM

## 2023-10-28 DIAGNOSIS — S7002XA Contusion of left hip, initial encounter: Secondary | ICD-10-CM | POA: Insufficient documentation

## 2023-10-28 DIAGNOSIS — Y92009 Unspecified place in unspecified non-institutional (private) residence as the place of occurrence of the external cause: Secondary | ICD-10-CM | POA: Diagnosis not present

## 2023-10-28 DIAGNOSIS — I10 Essential (primary) hypertension: Secondary | ICD-10-CM | POA: Insufficient documentation

## 2023-10-28 DIAGNOSIS — Z79899 Other long term (current) drug therapy: Secondary | ICD-10-CM | POA: Diagnosis not present

## 2023-10-28 DIAGNOSIS — W1839XA Other fall on same level, initial encounter: Secondary | ICD-10-CM | POA: Diagnosis not present

## 2023-10-28 DIAGNOSIS — S79912A Unspecified injury of left hip, initial encounter: Secondary | ICD-10-CM | POA: Diagnosis present

## 2023-10-28 DIAGNOSIS — R7309 Other abnormal glucose: Secondary | ICD-10-CM | POA: Diagnosis not present

## 2023-10-28 LAB — BASIC METABOLIC PANEL WITH GFR
Anion gap: 16 — ABNORMAL HIGH (ref 5–15)
BUN: 12 mg/dL (ref 8–23)
CO2: 22 mmol/L (ref 22–32)
Calcium: 10.6 mg/dL — ABNORMAL HIGH (ref 8.9–10.3)
Chloride: 104 mmol/L (ref 98–111)
Creatinine, Ser: 0.89 mg/dL (ref 0.44–1.00)
GFR, Estimated: >60 mL/min (ref >60)
Glucose, Bld: 107 mg/dL — ABNORMAL HIGH (ref 70–99)
Potassium: 4.1 mmol/L (ref 3.5–5.1)
Sodium: 142 mmol/L (ref 135–145)

## 2023-10-28 LAB — PROTIME-INR
INR: 0.9 (ref 0.8–1.2)
Prothrombin Time: 12.5 s (ref 11.4–15.2)

## 2023-10-28 LAB — CBC WITH DIFFERENTIAL/PLATELET
Abs Immature Granulocytes: 0.06 10*3/uL (ref 0.00–0.07)
Basophils Absolute: 0 10*3/uL (ref 0.0–0.1)
Basophils Relative: 0 %
Eosinophils Absolute: 0.1 10*3/uL (ref 0.0–0.5)
Eosinophils Relative: 1 %
HCT: 41.7 % (ref 36.0–46.0)
Hemoglobin: 14.2 g/dL (ref 12.0–15.0)
Immature Granulocytes: 1 %
Lymphocytes Relative: 12 %
Lymphs Abs: 1.6 10*3/uL (ref 0.7–4.0)
MCH: 29 pg (ref 26.0–34.0)
MCHC: 34.1 g/dL (ref 30.0–36.0)
MCV: 85.1 fL (ref 80.0–100.0)
Monocytes Absolute: 0.6 10*3/uL (ref 0.1–1.0)
Monocytes Relative: 5 %
Neutro Abs: 10.8 10*3/uL — ABNORMAL HIGH (ref 1.7–7.7)
Neutrophils Relative %: 81 %
Platelets: 298 10*3/uL (ref 150–400)
RBC: 4.9 MIL/uL (ref 3.87–5.11)
RDW: 14.4 % (ref 11.5–15.5)
WBC: 13.2 10*3/uL — ABNORMAL HIGH (ref 4.0–10.5)
nRBC: 0 % (ref 0.0–0.2)

## 2023-10-28 MED ORDER — OXYCODONE HCL 5 MG PO TABS: 5.0000 mg | ORAL_TABLET | ORAL | 0 refills | Status: DC | PRN

## 2023-10-28 MED ORDER — ONDANSETRON HCL 4 MG/2ML IJ SOLN
4.0000 mg | Freq: Once | INTRAMUSCULAR | Status: AC
  Administered 2023-10-28: 4 mg via INTRAVENOUS
  Filled 2023-10-28: qty 2

## 2023-10-28 MED ORDER — MORPHINE SULFATE (PF) 4 MG/ML IV SOLN
4.0000 mg | INTRAVENOUS | Status: AC | PRN
  Administered 2023-10-28 (×2): 4 mg via INTRAVENOUS
  Filled 2023-10-28 (×2): qty 1

## 2023-10-28 MED ORDER — ONDANSETRON 4 MG PO TBDP: 4.0000 mg | ORAL_TABLET | Freq: Three times a day (TID) | ORAL | 0 refills | Status: DC | PRN

## 2023-10-28 NOTE — ED Notes (Signed)
 ED Provider at bedside.

## 2023-10-28 NOTE — ED Notes (Signed)
 Pt placed on 2L Flasher d/t o2 dropping after receiving morphine

## 2023-10-28 NOTE — ED Notes (Signed)

## 2023-10-28 NOTE — ED Triage Notes (Addendum)
 L hip pain after a fall ~ 1 hour ago. Hx of surgery on same hip with pin placement. Daughter helped her into a chair at home. Pt unable to put any weight on that leg. Denies other injury. Denies blood thinner use. Fall occurred while trying to pick something up out of a drawer and losing her balance.

## 2023-10-28 NOTE — Discharge Instructions (Addendum)
 Apply ice for 30 minutes at a time, 4 times a day.  Use the walker as needed.  You may take acetaminophen  as needed for pain, add oxycodone as needed for more severe pain.  Please be aware that oxycodone will make you drowsy and more prone to fall.  It will also constipate you.  I suggested she use a laxative such as polyethylene glycol (MiraLAX) while taking the narcotic.  Please work with your orthopedic physician and primary care provider to set up appropriate home health services and rehabilitation services.

## 2023-10-28 NOTE — ED Notes (Signed)
 Patient transported to CT

## 2023-10-28 NOTE — ED Notes (Signed)
 Attempted to ambulate pt with standby assist with use of walker. Pt endorses feeling dizzy and refused to bear wt on LT leg. Pt placed back in bed. EDP Candelaria Chaco notified

## 2023-10-28 NOTE — ED Notes (Signed)
 Pt returned from CT

## 2023-10-28 NOTE — ED Provider Notes (Signed)
 [facility] Provider Note   CSN: [id number] Arrival date & time: 10/28/23  0042     History  Chief Complaint  Patient presents with   Bethany Schneider is a 83 y.o. female.  The history is provided by the patient and a relative.  Fall  She has history of hypertension, hyperlipidemia and comes in following a fall with injury to left hip.  She has not been able to bear weight since then.  She denies other injury.  She does have history of prior hip fracture which was treated with placement of pins.   Home Medications Prior to Admission medications   Medication Sig Start Date End Date Taking? Authorizing Provider  ondansetron  (ZOFRAN -ODT) 4 MG disintegrating tablet Take 1 tablet (4 mg total) by mouth every 8 (eight) hours as needed for nausea or vomiting. 10/28/23  Yes Alissa April, MD  oxyCODONE (ROXICODONE) 5 MG immediate release tablet Take 1 tablet (5 mg total) by mouth every 4 (four) hours as needed for severe pain (pain score 7-10). 10/28/23  Yes Alissa April, MD  acetaminophen  (TYLENOL ) 325 MG tablet Take 2 tablets (650 mg total) by mouth every 6 (six) hours as needed for mild pain (pain score 1-3) (or Fever >/= 101). 09/30/23   Armenta Landau, MD  amLODipine  (NORVASC ) 5 MG tablet Take 1 tablet (5 mg total) by mouth daily. Patient taking differently: Take 5 mg by mouth at bedtime. 02/15/22   Doreen Gamma, MD  CALCIUM  MAGNESIUM ZINC PO Take 1 tablet by mouth daily.    [provider]  famotidine  (PEPCID ) 40 MG tablet Take 1 tablet (40 mg total) by mouth at bedtime. 02/15/22   Doreen Gamma, MD  gabapentin  (NEURONTIN ) 100 MG capsule Take 1 capsule (100 mg total) by mouth 3 (three) times daily. Patient taking differently: Take 300 mg by mouth daily. 02/15/22   Doreen Gamma, MD  mirabegron  ER (MYRBETRIQ ) 50 MG TB24 tablet Take 1 tablet (50 mg total) by mouth daily. 03/02/22   Doreen Gamma, MD  Multiple  Vitamins-Minerals (MULTIVITAMIN WOMEN 50+) TABS Take 1 tablet by mouth daily.    [provider]  pantoprazole  (PROTONIX ) 40 MG tablet Take 40 mg by mouth daily.    [provider]  sertraline  (ZOLOFT ) 100 MG tablet Take 1 tablet by mouth once daily 08/09/22   Doreen Gamma, MD  zolpidem  (AMBIEN ) 10 MG tablet Take 1 tablet (10 mg total) by mouth at bedtime as needed for sleep. Patient taking differently: Take 5 mg by mouth at bedtime as needed for sleep. 06/20/22   Doreen Gamma, MD      Allergies    Asa [aspirin], Cipro [ciprofloxacin hcl], Motrin [ibuprofen], Percocet [oxycodone-acetaminophen ], Ultram [tramadol], and Penicillins    Review of Systems   Review of Systems  All other systems reviewed and are negative.   Physical Exam Updated Vital Signs BP (!) 122/47   Pulse 82   Temp 98.7 F (37.1 C) (Oral)   Resp (!) 21   Ht 5\' 4"  (1.626 m)   Wt 70.3 kg   LMP  (LMP Unknown)   SpO2 94%   BMI 26.61 kg/m  Physical Exam Vitals and nursing note reviewed.   83 year old female, resting comfortably and in no acute distress. Vital signs are significant for elevated blood pressure. Oxygen saturation is 95%, which is normal. Head is normocephalic and atraumatic. PERRLA, EOMI. Oropharynx is  clear. Neck is nontender. Back is nontender and there is no CVA tenderness. Lungs are clear without rales, wheezes, or rhonchi. Chest is nontender. Heart has regular rate and rhythm without murmur. Abdomen is soft, flat, nontender. Extremities: There is tenderness to palpation in the left hip and proximal femur, pain with passive range of motion of left hip.  No other tenderness elicited, full range of motion present all other joints without pain. Skin is warm and dry without rash. Neurologic: Awake and alert.  ED Results / Procedures / Treatments   Labs (all labs ordered are listed, but only abnormal results are displayed) Labs Reviewed  BASIC METABOLIC PANEL WITH GFR -  Abnormal; Notable for the following components:      Result Value   Glucose, Bld 107 (*)    Calcium  10.6 (*)    Anion gap 16 (*)    All other components within normal limits  CBC WITH DIFFERENTIAL/PLATELET - Abnormal; Notable for the following components:   WBC 13.2 (*)    Neutro Abs 10.8 (*)    All other components within normal limits  PROTIME-INR    EKG EKG Interpretation Date/Time:  Sunday October 28 2023 02:27:36 EDT Ventricular Rate:  84 PR Interval:  159 QRS Duration:  81 QT Interval:  402 QTC Calculation: 476 R Axis:   10  Text Interpretation: Sinus rhythm Anterior infarct, old LOW VOLTAGE When compared with ECG of 09/30/2023, No significant change was found Confirmed by Alissa April ([id number]) on 10/28/2023 2:33:21 AM  Radiology CT Hip Left Wo Contrast Result Date: 10/28/2023 CLINICAL DATA:  Fall, left hip pain EXAM: CT OF THE LEFT HIP WITHOUT CONTRAST TECHNIQUE: Multidetector CT imaging of the left hip was performed according to the standard protocol. Multiplanar CT image reconstructions were also generated. RADIATION DOSE REDUCTION: This exam was performed according to the departmental dose-optimization program which includes automated exposure control, adjustment of the mA and/or kV according to patient size and/or use of iterative reconstruction technique. COMPARISON:  None Available. FINDINGS: Bones/Joint/Cartilage Surgical changes of left hip ORIF are identified. Normal alignment. No acute fracture or dislocation. Left hip degenerative arthritis. No lytic or blastic bone lesion. Ligaments Suboptimally assessed by CT. Muscles and Tendons Unremarkable Soft tissues Mild diverticulosis of the sigmoid colon. Periarticular soft tissues of the left hip are unremarkable. IMPRESSION: 1. No acute fracture or dislocation. 2. Surgical changes of left hip ORIF. 3. Left hip degenerative arthritis. Electronically Signed   By: Worthy Heads M.D.   On: 10/28/2023 04:55   DG FEMUR MIN 2 VIEWS  LEFT Result Date: 10/28/2023 CLINICAL DATA:  Left hip pain after fall EXAM: LEFT FEMUR 2 VIEWS COMPARISON:  None Available. FINDINGS: Proximal femur evaluated on dedicated hip radiographs. No fracture of the mid/distal left femur. Left knee arthroplasty, without evidence of complication. IMPRESSION: No fracture or dislocation is seen. Left hip arthroplasty, without evidence of complication. Electronically Signed   By: Zadie Herter M.D.   On: 10/28/2023 02:33   DG Chest 1 View Result Date: 10/28/2023 CLINICAL DATA:  Fall, left hip pain EXAM: CHEST  1 VIEW COMPARISON:  09/29/2023 FINDINGS: Lungs are clear.  No pleural effusion or pneumothorax. The heart is normal in size. Thoracic aortic atherosclerosis. IMPRESSION: No acute cardiopulmonary disease. Electronically Signed   By: Zadie Herter M.D.   On: 10/28/2023 02:30   DG Hip Unilat W or Wo Pelvis 2-3 Views Left Result Date: 10/28/2023 CLINICAL DATA:  Fall, left hip pain EXAM: DG HIP (WITH OR WITHOUT PELVIS)  2-3V LEFT COMPARISON:  None Available. FINDINGS: Status post ORIF of the left hip, without complication. Right hip arthroplasty, without complication. No fracture or dislocation is seen. Visualized bony pelvis appears intact. IMPRESSION: Status post ORIF of the left hip, without complication. Right hip arthroplasty, without complication. No fracture or dislocation is seen. Electronically Signed   By: Zadie Herter M.D.   On: 10/28/2023 02:30    Procedures Procedures    Medications Ordered in ED Medications  morphine (PF) 4 MG/ML injection 4 mg (4 mg Intravenous Given 10/28/23 0427)  ondansetron  (ZOFRAN ) injection 4 mg (4 mg Intravenous Given 10/28/23 0240)    ED Course/ Medical Decision Making/ A&P                                 Medical Decision Making Amount and/or Complexity of Data Reviewed Labs: ordered. Radiology: ordered.  Risk Prescription drug management.   Fall with injury to left hip.  History of prior  surgery on that hip.  I have reviewed her past records, and she was admitted to [facility] on 02/21/2021 for left femoral neck fracture treated with open reduction and internal fixation.  I have ordered morphine for pain.  I have ordered x-rays of left hip and femur as well as screening labs.  X-rays show no evidence of fracture.  I have independently viewed the images, and agree with the radiologist's interpretation.  I have reviewed her laboratory test, my interpretation is mildly elevated random glucose level, borderline elevated calcium  which is not felt to be clinically significant, mild leukocytosis which is nonspecific.  I have ordered to have the patient ambulated.  However, she was not able to tolerate ambulation.  She received additional morphine for pain and I ordered CT scan of left hip to look for evidence of occult fracture.  CT scan shows no evidence of fracture.  I have independently viewed the images, and agree with the radiologist's interpretation.  I have discussed the findings with the daughter.  I stated that she they would need to be cared for at home or go to a rehabilitation facility.  Daughter states that she can take care of her at home.  I am discharging her with prescription for oxycodone and also ondansetron  oral dissolving tablet.  Advised to use the walker as needed and to work with her primary care provider and her orthopedic physician to get appropriate home health services.  Final Clinical Impression(s) / ED Diagnoses Final diagnoses:  Fall at home, initial encounter  Contusion of left hip, initial encounter  Difficulty walking  Elevated random blood glucose level    Rx / DC Orders ED Discharge Orders          Ordered    ondansetron  (ZOFRAN -ODT) 4 MG disintegrating tablet  Every 8 hours PRN        10/28/23 0548    oxyCODONE (ROXICODONE) 5 MG immediate release tablet  Every 4 hours PRN        10/28/23 0548               Alissa April, MD 10/28/23 [phone]

## 2023-10-28 NOTE — ED Notes (Signed)
 Patient transported to X-ray

## 2024-04-06 NOTE — Progress Notes (Signed)
 Chief Complaint: Bladderproblems  History of Present Illness:  Bethany Schneider is a 83 y.o. female who is seen in consultation from Jolee Madelin Patch, MD for evaluation of LUTS w/ urgency incontinence.   Past Medical History:  Past Medical History:  Diagnosis Date   Arthritis    DJD (degenerative joint disease) right hip   History of MRI    Hyperlipidemia    Hypertension    Lumbar radiculopathy 09/13/2014   Peptic ulceration    Sleep disturbance 06/03/2015   Trochanteric bursitis of right hip 09/13/2014    Past Surgical History:  Past Surgical History:  Procedure Laterality Date   ANKLE SURGERY  1985   cartilage replaced   BREAST BIOPSY  1965   BREAST REDUCTION SURGERY Bilateral 1990   CATARACT EXTRACTION W/ INTRAOCULAR LENS  IMPLANT, BILATERAL  2004   CHOLECYSTECTOMY  2004   COLONOSCOPY  07/14/2005   KIDNEY SURGERY  1962   NASAL TURBINATE REDUCTION  1987 and 2015   TOTAL ABDOMINAL HYSTERECTOMY  1987   TOTAL HIP ARTHROPLASTY Right 02/08/2017   Dr. Vinie Fonder   TOTAL KNEE ARTHROPLASTY Bilateral 2012 & 2014   TUBAL LIGATION  1975   WISDOM TOOTH EXTRACTION  1970    Allergies:  Allergies  Allergen Reactions   Asa [Aspirin] Other (See Comments)    Hx gastric ulcer   Cipro [Ciprofloxacin Hcl] Other (See Comments)    Sores/ulcers in throat   Motrin [Ibuprofen] Other (See Comments)    Hx gastric ulcer Stomach irritation   Percocet [Oxycodone -Acetaminophen ] Nausea Only   Ultram [Tramadol] Other (See Comments)    Stomach burning   Penicillins Rash    Family History:  Family History  Problem Relation Age of Onset   Hypertension Mother    Heart disease Father    Hypertension Father    Colon cancer Sister    Cancer Brother    Healthy Daughter    Healthy Daughter    Healthy Son     Social History:  Social History   Tobacco Use   Smoking status: Never   Smokeless tobacco: Never  Vaping Use   Vaping status: Never Used  Substance Use Topics    Alcohol use: Never   Drug use: No    Review of symptoms:  Constitutional:  Negative for unexplained weight loss, night sweats, fever, chills ENT:  Negative for nose bleeds, sinus pain, painful swallowing CV:  Negative for chest pain, shortness of breath, exercise intolerance, palpitations, loss of consciousness Resp:  Negative for cough, wheezing, shortness of breath GI:  Negative for nausea, vomiting, diarrhea, bloody stools GU:  Positives noted in HPI; otherwise negative for gross hematuria, dysuria, urinary incontinence Neuro:  Negative for seizures, poor balance, limb weakness, slurred speech Psych:  Negative for lack of energy, depression, anxiety Endocrine:  Negative for polydipsia, polyuria, symptoms of hypoglycemia (dizziness, hunger, sweating) Hematologic:  Negative for anemia, purpura, petechia, prolonged or excessive bleeding, use of anticoagulants  Allergic:  Negative for difficulty breathing or choking as a result of exposure to anything; no shellfish allergy; no allergic response (rash/itch) to materials, foods  Physical exam: LMP  (LMP Unknown)  GENERAL APPEARANCE:  Well appearing, well developed, well nourished, NAD HEENT: Atraumatic, Normocephalic. NECK: Normal appearance LUNGS: Normal inspiratory and expiratory excursion HEART: Regular Rate ABDOMEN: *** EXTREMITIES: Moves all extremities well.  Without clubbing, cyanosis, or edema. NEUROLOGIC:  Alert and oriented x 3, normal gait, CN II-XII grossly intact.  MENTAL STATUS:  Appropriate. SKIN:  Warm, dry and intact.    Results: No results found for this or any previous visit (from the past 24 hours).  I have reviewed prior patient's records  I have reviewed referring/prior physicians records  I have reviewed urinalysis  I have reviewed prior urine cultures  I reviewed prior imaging studies  Assessment: No diagnosis found.   Plan: ***

## 2024-04-07 ENCOUNTER — Ambulatory Visit: Admitting: Urology

## 2024-04-07 VITALS — BP 175/75 | HR 76 | Ht 63.0 in | Wt 148.0 lb

## 2024-04-07 DIAGNOSIS — R3915 Urgency of urination: Secondary | ICD-10-CM

## 2024-04-07 DIAGNOSIS — N3941 Urge incontinence: Secondary | ICD-10-CM

## 2024-04-07 DIAGNOSIS — Z87440 Personal history of urinary (tract) infections: Secondary | ICD-10-CM

## 2024-04-07 DIAGNOSIS — R8271 Bacteriuria: Secondary | ICD-10-CM

## 2024-04-07 DIAGNOSIS — R350 Frequency of micturition: Secondary | ICD-10-CM | POA: Diagnosis not present

## 2024-04-07 DIAGNOSIS — N952 Postmenopausal atrophic vaginitis: Secondary | ICD-10-CM

## 2024-04-07 DIAGNOSIS — R300 Dysuria: Secondary | ICD-10-CM | POA: Diagnosis not present

## 2024-04-07 LAB — URINALYSIS, ROUTINE W REFLEX MICROSCOPIC
Bilirubin, UA: NEGATIVE
Glucose, UA: NEGATIVE
Ketones, UA: NEGATIVE
Nitrite, UA: NEGATIVE
Protein,UA: NEGATIVE
Specific Gravity, UA: 1.015 (ref 1.005–1.030)
Urobilinogen, Ur: 0.2 mg/dL (ref 0.2–1.0)
pH, UA: 7 (ref 5.0–7.5)

## 2024-04-07 LAB — MICROSCOPIC EXAMINATION: WBC, UA: >30 /HPF — AB (ref 0–5)

## 2024-04-07 LAB — BLADDER SCAN AMB NON-IMAGING

## 2024-04-07 MED ORDER — SOLIFENACIN SUCCINATE 10 MG PO TABS: 10.0000 mg | ORAL_TABLET | Freq: Every day | ORAL | 11 refills | Status: DC

## 2024-04-07 MED ORDER — MIRABEGRON ER 50 MG PO TB24: 50.0000 mg | ORAL_TABLET | Freq: Every day | ORAL | 11 refills | Status: DC

## 2024-04-09 ENCOUNTER — Ambulatory Visit: Payer: Self-pay | Admitting: Urology

## 2024-04-09 LAB — URINE CULTURE

## 2024-04-22 ENCOUNTER — Encounter: Admitting: Urology

## 2024-05-14 ENCOUNTER — Ambulatory Visit: Admitting: Urology

## 2024-05-14 ENCOUNTER — Other Ambulatory Visit: Payer: Self-pay | Admitting: Urology

## 2024-05-14 ENCOUNTER — Encounter: Payer: Self-pay | Admitting: Urology

## 2024-05-14 VITALS — BP 160/76 | HR 80 | Ht 63.0 in | Wt 149.0 lb

## 2024-05-14 DIAGNOSIS — Z87440 Personal history of urinary (tract) infections: Secondary | ICD-10-CM

## 2024-05-14 DIAGNOSIS — N3941 Urge incontinence: Secondary | ICD-10-CM

## 2024-05-14 DIAGNOSIS — N3281 Overactive bladder: Secondary | ICD-10-CM | POA: Diagnosis not present

## 2024-05-14 DIAGNOSIS — N390 Urinary tract infection, site not specified: Secondary | ICD-10-CM | POA: Insufficient documentation

## 2024-05-14 LAB — URINALYSIS, ROUTINE W REFLEX MICROSCOPIC
Bilirubin, UA: NEGATIVE
Glucose, UA: NEGATIVE
Nitrite, UA: NEGATIVE
Protein,UA: NEGATIVE
RBC, UA: NEGATIVE
Specific Gravity, UA: 1.02 (ref 1.005–1.030)
Urobilinogen, Ur: 0.2 mg/dL (ref 0.2–1.0)
pH, UA: 5.5 (ref 5.0–7.5)

## 2024-05-14 LAB — MICROSCOPIC EXAMINATION

## 2024-05-14 MED ORDER — TROSPIUM CHLORIDE 20 MG PO TABS: 20.0000 mg | ORAL_TABLET | Freq: Two times a day (BID) | ORAL | 11 refills | Status: AC

## 2024-05-14 MED ORDER — PREMARIN 0.625 MG/GM VA CREA: TOPICAL_CREAM | VAGINAL | 12 refills | Status: AC

## 2024-05-14 NOTE — Progress Notes (Signed)
 Assessment: 1. Urgency incontinence   2. OAB (overactive bladder)   3. Frequent UTI     Plan: I personally reviewed the patient's chart including provider notes, lab results. I reviewed the prior office note from Dr. Matilda as well as Dr. Alvia notes. I had a lengthy discussion with the patient and her daughter regarding symptoms of overactive bladder and urge incontinence.  I discussed management options including avoidance of dietary irritants, behavioral modification, medical therapy, neuromodulation, and chemodenervation. Resolve MDX culture today. Recommend that she resume the vaginal hormone cream. Prescription for Premarin vaginal cream sent to her pharmacy. Trial of trospium 20 mg twice daily. Bladder diet sheet given. Schedule for cystoscopy and pelvic exam in the next 3 weeks. Will arrange for urodynamic testing at [facility] for evaluation of urge incontinence   Chief Complaint: Chief Complaint  Patient presents with   Urinary Incontinence    HPI: Bethany Schneider is a 83 y.o. female who presents for continued evaluation of frequent UTIs and lower urinary tract symptoms. She was recently evaluated by Dr. Matilda on 04/07/2024. She has a longstanding urologic history.  Typical UTI symptoms include frequency, urgency, and urge incontinence.  She also has nighttime incontinence.  She is using 8-10 pull-ups a day and a pure wick at night.  She was previously managed with methenamine twice daily, vaginal estrogen cream and Gemtesa.  She continued to have symptoms despite the above treatment.  She was previously evaluated by Dr. Dorothyann Alvia at [facility].  Most recent urine cultures: April 07, 2024 --<10K mixed flora March 13, 2024--E. coli (MDR), Enterococcus February 08, 2024--susceptible E. coli January 10, 2024--susceptible  E. coli December 11, 2023--susceptible E. coli May 14, 2023--susceptible E. Coli  She was started on Myrbetriq  50 mg daily as well  as solifenacin 10 mg daily. She returns today for follow-up and for a second opinion on her bladder symptoms.  She discontinued the Myrbetriq  and solifenacin as she did not feel like these were helping her symptoms.  She also discontinued the Estrace vaginal cream. She continues with symptoms of frequency, urgency, pain with urination in the urethra during and after voiding, urge and unaware incontinence, and nocturia 3-4 times.  She also has discomfort in the urethra when she is not voiding.  The symptoms typically last 10-15 minutes and occur intermittently throughout the day.  No gross hematuria or flank pain.  She continues to use multiple pull-ups during the day and a PureWick at night for management of her incontinence.  The incontinence symptoms are preventing her from leaving her home.  The symptoms have been present for 5 years with worsening in the past 12-18 months.  Portions of the above documentation were copied from a prior visit for review purposes only.  Allergies: Allergies  Allergen Reactions   Asa [Aspirin] Other (See Comments)    Hx gastric ulcer   Cipro [Ciprofloxacin Hcl] Other (See Comments)    Sores/ulcers in throat   Motrin [Ibuprofen] Other (See Comments)    Hx gastric ulcer Stomach irritation   Percocet [Oxycodone -Acetaminophen ] Nausea Only   Ultram [Tramadol] Other (See Comments)    Stomach burning   Penicillins Rash    PMH: Past Medical History:  Diagnosis Date   Arthritis    DJD (degenerative joint disease) right hip   History of MRI    Hyperlipidemia    Hypertension    Lumbar radiculopathy 09/13/2014   Peptic ulceration    Sleep disturbance 06/03/2015   Trochanteric bursitis of  right hip 09/13/2014    PSH: Past Surgical History:  Procedure Laterality Date   ANKLE SURGERY  1985   cartilage replaced   BREAST BIOPSY  1965   BREAST REDUCTION SURGERY Bilateral 1990   CATARACT EXTRACTION W/ INTRAOCULAR LENS  IMPLANT, BILATERAL  2004    CHOLECYSTECTOMY  2004   COLONOSCOPY  07/14/2005   KIDNEY SURGERY  1962   NASAL TURBINATE REDUCTION  1987 and 2015   TOTAL ABDOMINAL HYSTERECTOMY  1987   TOTAL HIP ARTHROPLASTY Right 02/08/2017   Dr. Vinie Fonder   TOTAL KNEE ARTHROPLASTY Bilateral 2012 & 2014   TUBAL LIGATION  1975   WISDOM TOOTH EXTRACTION  1970    SH: Social History   Tobacco Use   Smoking status: Never   Smokeless tobacco: Never  Vaping Use   Vaping status: Never Used  Substance Use Topics   Alcohol use: Never   Drug use: No    ROS: Constitutional:  Negative for fever, chills, weight loss CV: Negative for chest pain, previous MI, hypertension Respiratory:  Negative for shortness of breath, wheezing, sleep apnea, frequent cough GI:  Negative for nausea, vomiting, bloody stool, GERD  PE: BP (!) 160/76   Pulse 80   Ht 5' 3 (1.6 m)   Wt 149 lb (67.6 kg)   LMP  (LMP Unknown)   BMI 26.39 kg/m  GENERAL APPEARANCE:  Well appearing, well developed, well nourished, NAD HEENT:  Atraumatic, normocephalic, oropharynx clear NECK:  Supple without lymphadenopathy or thyromegaly ABDOMEN:  Soft, non-tender, no masses EXTREMITIES:  Moves all extremities well, without clubbing, cyanosis, or edema NEUROLOGIC:  Alert and oriented x 3, normal gait, CN II-XII grossly intact MENTAL STATUS:  appropriate BACK:  Non-tender to palpation, No CVAT SKIN:  Warm, dry, and intact   Results: U/A: 11-30 WBCs, 3-10 RBCs, few bacteria

## 2024-05-19 ENCOUNTER — Encounter: Payer: Self-pay | Admitting: Urology

## 2024-05-20 ENCOUNTER — Other Ambulatory Visit: Payer: Self-pay | Admitting: Urology

## 2024-05-20 ENCOUNTER — Telehealth: Payer: Self-pay | Admitting: Urology

## 2024-05-20 DIAGNOSIS — N390 Urinary tract infection, site not specified: Secondary | ICD-10-CM

## 2024-05-20 MED ORDER — FOSFOMYCIN TROMETHAMINE 3 G PO PACK: 3.0000 g | PACK | Freq: Once | ORAL | 0 refills | Status: AC

## 2024-05-20 NOTE — Telephone Encounter (Signed)
 Resolve Mdx culture reviewed. Rx for fosfomycin 3 g PO x 1 sent to pharmacy. MyChart message sent.

## 2024-06-06 ENCOUNTER — Ambulatory Visit: Admitting: Urology

## 2024-06-06 ENCOUNTER — Encounter: Payer: Self-pay | Admitting: Urology

## 2024-06-06 VITALS — BP 155/80 | HR 98

## 2024-06-06 DIAGNOSIS — N3281 Overactive bladder: Secondary | ICD-10-CM | POA: Diagnosis not present

## 2024-06-06 DIAGNOSIS — R8290 Unspecified abnormal findings in urine: Secondary | ICD-10-CM

## 2024-06-06 DIAGNOSIS — Z87440 Personal history of urinary (tract) infections: Secondary | ICD-10-CM

## 2024-06-06 DIAGNOSIS — N390 Urinary tract infection, site not specified: Secondary | ICD-10-CM

## 2024-06-06 DIAGNOSIS — N952 Postmenopausal atrophic vaginitis: Secondary | ICD-10-CM

## 2024-06-06 DIAGNOSIS — N3941 Urge incontinence: Secondary | ICD-10-CM

## 2024-06-06 LAB — URINALYSIS, ROUTINE W REFLEX MICROSCOPIC
Bilirubin, UA: NEGATIVE
Glucose, UA: NEGATIVE
Nitrite, UA: NEGATIVE
Protein,UA: NEGATIVE
Specific Gravity, UA: 1.015 (ref 1.005–1.030)
Urobilinogen, Ur: 0.2 mg/dL (ref 0.2–1.0)
pH, UA: 5.5 (ref 5.0–7.5)

## 2024-06-06 LAB — MICROSCOPIC EXAMINATION: WBC, UA: >30 /HPF — AB (ref 0–5)

## 2024-06-06 MED ORDER — NITROFURANTOIN MONOHYD MACRO 100 MG PO CAPS: 100.0000 mg | ORAL_CAPSULE | Freq: Two times a day (BID) | ORAL | 0 refills | Status: DC

## 2024-06-06 NOTE — Progress Notes (Signed)
 Assessment: 1. Frequent UTI   2. Urgency incontinence   3. OAB (overactive bladder)   4. Vaginal atrophy     Plan: Continue Premarin  vaginal cream  Continue trospium  20 mg twice daily. Continue bladder diet sheet given Urine culture on cath specimen Begin Macrobid  x 7 days.  Rx sent. Reschedule for cystoscopy in 1 month Urodynamics scheduled for 06/23/2024 - may postpone due to improvement with trospium    Chief Complaint: Chief Complaint  Patient presents with   Cysto    HPI: Bethany Schneider is a 83 y.o. female who presents for continued evaluation of frequent UTIs, OAB with urge incontinence and lower urinary tract symptoms. She was recently evaluated by Dr. Matilda on 04/07/2024. She has a longstanding urologic history.  Typical UTI symptoms include frequency, urgency, and urge incontinence.  She also has nighttime incontinence.  She is using 8-10 pull-ups a day and a pure wick at night.  She was previously managed with methenamine twice daily, vaginal estrogen cream and Gemtesa.  She continued to have symptoms despite the above treatment.  She was previously evaluated by Dr. Dorothyann Ku at [facility].  Most recent urine cultures: April 07, 2024 --<10K mixed flora March 13, 2024--E. coli (MDR), Enterococcus February 08, 2024--susceptible E. coli January 10, 2024--susceptible  E. coli December 11, 2023--susceptible E. coli May 14, 2023--susceptible E. Coli  She was started on Myrbetriq  50 mg daily as well as solifenacin  10 mg daily. She returns today for follow-up and for a second opinion on her bladder symptoms.  She discontinued the Myrbetriq  and solifenacin  as she did not feel like these were helping her symptoms.  She also discontinued the Estrace vaginal cream. She continued with symptoms of frequency, urgency, pain with urination in the urethra during and after voiding, urge and unaware incontinence, and nocturia 3-4 times.  She also has discomfort in the  urethra when she is not voiding.  The symptoms typically last 10-15 minutes and occur intermittently throughout the day.  No gross hematuria or flank pain.  She continues to use multiple pull-ups during the day and a PureWick at night for management of her incontinence.  The incontinence symptoms are preventing her from leaving her home.  The symptoms have been present for 5 years with worsening in the past 12-18 months.  Resolve MDX urine culture from 05/14/2024 grew Enterococcus and E. coli.  She was treated with fosfomycin. She was restarted on Premarin  vaginal cream and given a trial of trospium  for her incontinence. She presents today for further evaluation with cystoscopy and pelvic exam.  She continues to have pain and burning in the urethra and vaginal area.  She has noted significant improvement in her incontinence with the trospium .  No side effects.  Portions of the above documentation were copied from a prior visit for review purposes only.  Allergies: Allergies  Allergen Reactions   Asa [Aspirin] Other (See Comments)    Hx gastric ulcer   Cipro [Ciprofloxacin Hcl] Other (See Comments)    Sores/ulcers in throat   Motrin [Ibuprofen] Other (See Comments)    Hx gastric ulcer Stomach irritation   Percocet [Oxycodone -Acetaminophen ] Nausea Only   Ultram [Tramadol] Other (See Comments)    Stomach burning   Penicillins Rash    PMH: Past Medical History:  Diagnosis Date   Arthritis    DJD (degenerative joint disease) right hip   History of MRI    Hyperlipidemia    Hypertension    Lumbar radiculopathy 09/13/2014  Peptic ulceration    Sleep disturbance 06/03/2015   Trochanteric bursitis of right hip 09/13/2014    PSH: Past Surgical History:  Procedure Laterality Date   ANKLE SURGERY  1985   cartilage replaced   BREAST BIOPSY  1965   BREAST REDUCTION SURGERY Bilateral 1990   CATARACT EXTRACTION W/ INTRAOCULAR LENS  IMPLANT, BILATERAL  2004   CHOLECYSTECTOMY  2004    COLONOSCOPY  07/14/2005   KIDNEY SURGERY  1962   NASAL TURBINATE REDUCTION  1987 and 2015   TOTAL ABDOMINAL HYSTERECTOMY  1987   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 Vinie Fonder   TOTAL KNEE ARTHROPLASTY Bilateral 2012 & 2014   TUBAL LIGATION  1975   WISDOM TOOTH EXTRACTION  1970    SH: Social History   Tobacco Use   Smoking status: Never   Smokeless tobacco: Never  Vaping Use   Vaping status: Never Used  Substance Use Topics   Alcohol use: Never   Drug use: No    ROS: Constitutional:  Negative for fever, chills, weight loss CV: Negative for chest pain, previous MI, hypertension Respiratory:  Negative for shortness of breath, wheezing, sleep apnea, frequent cough GI:  Negative for nausea, vomiting, bloody stool, GERD  PE: BP (!) 155/80   Pulse 98   LMP  (LMP Unknown)  GENERAL APPEARANCE:  Well appearing, well developed, well nourished, NAD HEENT:  Atraumatic, normocephalic, oropharynx clear NECK:  Supple without lymphadenopathy or thyromegaly ABDOMEN:  Soft, non-tender, no masses EXTREMITIES:  Moves all extremities well, without clubbing, cyanosis, or edema NEUROLOGIC:  Alert and oriented x 3, normal gait, CN II-XII grossly intact MENTAL STATUS:  appropriate BACK:  Non-tender to palpation, No CVAT SKIN:  Warm, dry, and intact GU: Urethra: no mass or discharge; minimally tender at meatus Vagina: atrophic changes; no mucosal lesions; no significant prolapse  In and out catheterization performed for sterile urine specimen.   Cath volume = 15 ml.  A chaperone was present during the examination.    Results: U/A: >30 WBCs, >30 RBCs, >10 epithelial cells, many bacteria, nitrite negative  Cath U/A:  >30 WBC, 3-10 RBC, mod bacteria

## 2024-06-09 ENCOUNTER — Ambulatory Visit: Admitting: Urology

## 2024-06-10 ENCOUNTER — Ambulatory Visit: Payer: Self-pay | Admitting: Urology

## 2024-06-10 DIAGNOSIS — N390 Urinary tract infection, site not specified: Secondary | ICD-10-CM

## 2024-06-10 LAB — URINE CULTURE

## 2024-06-10 MED ORDER — NITROFURANTOIN MONOHYD MACRO 100 MG PO CAPS: 100.0000 mg | ORAL_CAPSULE | Freq: Every day | ORAL | 2 refills | Status: AC

## 2024-07-21 ENCOUNTER — Encounter: Payer: Self-pay | Admitting: Urology

## 2024-07-21 ENCOUNTER — Ambulatory Visit: Admitting: Urology

## 2024-07-21 VITALS — BP 155/72 | HR 77

## 2024-07-21 DIAGNOSIS — Z09 Encounter for follow-up examination after completed treatment for conditions other than malignant neoplasm: Secondary | ICD-10-CM | POA: Diagnosis not present

## 2024-07-21 DIAGNOSIS — N952 Postmenopausal atrophic vaginitis: Secondary | ICD-10-CM

## 2024-07-21 DIAGNOSIS — N390 Urinary tract infection, site not specified: Secondary | ICD-10-CM

## 2024-07-21 DIAGNOSIS — N3941 Urge incontinence: Secondary | ICD-10-CM

## 2024-07-21 DIAGNOSIS — Z87440 Personal history of urinary (tract) infections: Secondary | ICD-10-CM | POA: Diagnosis not present

## 2024-07-21 DIAGNOSIS — N3281 Overactive bladder: Secondary | ICD-10-CM

## 2024-07-21 LAB — MICROSCOPIC EXAMINATION

## 2024-07-21 LAB — URINALYSIS, ROUTINE W REFLEX MICROSCOPIC
Glucose, UA: NEGATIVE
Leukocytes,UA: NEGATIVE
Nitrite, UA: NEGATIVE
Protein,UA: NEGATIVE
RBC, UA: NEGATIVE
Specific Gravity, UA: 1.02 (ref 1.005–1.030)
Urobilinogen, Ur: 0.2 mg/dL (ref 0.2–1.0)
pH, UA: 5.5 (ref 5.0–7.5)

## 2024-07-21 NOTE — Progress Notes (Signed)
 "  Assessment: 1. Frequent UTI   2. Urgency incontinence   3. OAB (overactive bladder)   4. Vaginal atrophy     Plan: Normal cystoscopy today. Continue Premarin  vaginal cream  Continue trospium  20 mg twice daily. Continue Macrobid  daily. Continue bladder diet. Return to office in 6 weeks.   Chief Complaint: Chief Complaint  Patient presents with   Frequent UTI    HPI: Bethany Schneider is a 84 y.o. female who presents for continued evaluation of frequent UTIs, OAB with urge incontinence and lower urinary tract symptoms. She was recently evaluated by Dr. Matilda on 04/07/2024. She has a longstanding urologic history.  Typical UTI symptoms include frequency, urgency, and urge incontinence.  She also has nighttime incontinence.  She is using 8-10 pull-ups a day and a pure wick at night.  She was previously managed with methenamine twice daily, vaginal estrogen cream and Gemtesa.  She continued to have symptoms despite the above treatment.  She was previously evaluated by Dr. Dorothyann Ku at [facility].  Most recent urine cultures: April 07, 2024 --<10K mixed flora March 13, 2024--E. coli (MDR), Enterococcus February 08, 2024--susceptible E. coli January 10, 2024--susceptible  E. coli December 11, 2023--susceptible E. coli May 14, 2023--susceptible E. Coli  She was started on Myrbetriq  50 mg daily as well as solifenacin  10 mg daily. She returns today for follow-up and for a second opinion on her bladder symptoms.  She discontinued the Myrbetriq  and solifenacin  as she did not feel like these were helping her symptoms.  She also discontinued the Estrace vaginal cream. She continued with symptoms of frequency, urgency, pain with urination in the urethra during and after voiding, urge and unaware incontinence, and nocturia 3-4 times.  She also has discomfort in the urethra when she is not voiding.  The symptoms typically last 10-15 minutes and occur intermittently throughout  the day.  No gross hematuria or flank pain.  She continues to use multiple pull-ups during the day and a PureWick at night for management of her incontinence.  The incontinence symptoms are preventing her from leaving her home.  The symptoms have been present for 5 years with worsening in the past 12-18 months.  Resolve MDX urine culture from 05/14/2024 grew Enterococcus and E. coli.  She was treated with fosfomycin . She was restarted on Premarin  vaginal cream and given a trial of trospium  for her incontinence. She presents today for further evaluation with cystoscopy and pelvic exam.  At her visit in December 2025, she continued to have pain and burning in the urethra and vaginal area.  She noted significant improvement in her incontinence with the trospium .  No side effects. Cystoscopy was postponed due to apparent UTI. Pelvic exam demonstrated no urethral mass or discharge and some minimal tenderness at the urethral meatus.  Atrophic changes without mucosal lesions were noted vaginally.   I&O cath showed to 15 mL. Urine culture grew 50-100 K E. coli.  Treated with Macrobid  x 7 days and started on daily Macrobid .  She returns today for cystoscopy.  She continues on daily Macrodantin  and trospium .  Her urethral and bladder discomfort have resolved.  She continues to have significant improvement in her incontinence with the trospium .  No side effects.  No dysuria or gross hematuria.   Portions of the above documentation were copied from a prior visit for review purposes only.  Allergies: Allergies  Allergen Reactions   Asa [Aspirin] Other (See Comments)    Hx gastric ulcer   Cipro [  Ciprofloxacin Hcl] Other (See Comments)    Sores/ulcers in throat   Motrin [Ibuprofen] Other (See Comments)    Hx gastric ulcer Stomach irritation   Percocet [Oxycodone -Acetaminophen ] Nausea Only   Ultram [Tramadol] Other (See Comments)    Stomach burning   Penicillins Rash    PMH: Past Medical History:   Diagnosis Date   Arthritis    DJD (degenerative joint disease) right hip   History of MRI    Hyperlipidemia    Hypertension    Lumbar radiculopathy 09/13/2014   Peptic ulceration    Sleep disturbance 06/03/2015   Trochanteric bursitis of right hip 09/13/2014    PSH: Past Surgical History:  Procedure Laterality Date   ANKLE SURGERY  1985   cartilage replaced   BREAST BIOPSY  1965   BREAST REDUCTION SURGERY Bilateral 1990   CATARACT EXTRACTION W/ INTRAOCULAR LENS  IMPLANT, BILATERAL  2004   CHOLECYSTECTOMY  2004   COLONOSCOPY  07/14/2005   KIDNEY SURGERY  1962   NASAL TURBINATE REDUCTION  1987 and 2015   TOTAL ABDOMINAL HYSTERECTOMY  1987   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 Vinie Fonder   TOTAL KNEE ARTHROPLASTY Bilateral 2012 & 2014   TUBAL LIGATION  1975   WISDOM TOOTH EXTRACTION  1970    SH: Social History   Tobacco Use   Smoking status: Never   Smokeless tobacco: Never  Vaping Use   Vaping status: Never Used  Substance Use Topics   Alcohol use: Never   Drug use: No    ROS: Constitutional:  Negative for fever, chills, weight loss CV: Negative for chest pain, previous MI, hypertension Respiratory:  Negative for shortness of breath, wheezing, sleep apnea, frequent cough GI:  Negative for nausea, vomiting, bloody stool, GERD  PE: BP (!) 155/72   Pulse 77   LMP  (LMP Unknown)  GENERAL APPEARANCE:  Well appearing, well developed, well nourished, NAD HEENT:  Atraumatic, normocephalic, oropharynx clear NECK:  Supple without lymphadenopathy or thyromegaly ABDOMEN:  Soft, non-tender, no masses EXTREMITIES:  Moves all extremities well, without clubbing, cyanosis, or edema NEUROLOGIC:  Alert and oriented x 3, normal gait, CN II-XII grossly intact MENTAL STATUS:  appropriate BACK:  Non-tender to palpation, No CVAT SKIN:  Warm, dry, and intact  Results: U/A: 0-5 WBC, 0-2 RBC  CYSTOSCOPY  Procedure: Flexible cystoscopy  Pre-Operative  Diagnosis:  Frequent UTI's  Post-Operative Diagnosis: Frequent UTI's  Anesthesia: local with lidocaine gel  Surgical Narrative:  After appropriate informed consent was obtained, the patient was prepped and draped in the usual sterile fashion in the supine position. She was correctly identified and the proper procedure delineated prior to proceeding. Sterile lidocaine gel was instilled in the urethra.  The flexible cystoscope was introduced without difficulty.  Findings:  Urethra: Normal  Bladder: no lesions seen  Ureteral orifices: normal  Additional findings: none  A bladder wash was not obtained for cytology.  She tolerated the procedure well.  A chaperone was present throughout the procedure. "

## 2024-09-03 ENCOUNTER — Ambulatory Visit: Admitting: Urology
